# Patient Record
Sex: Male | Born: 1940
Health system: Southern US, Community
[De-identification: ages and names within clinical notes are randomized; demographics above are authoritative.]

## PROBLEM LIST (undated history)

## (undated) DIAGNOSIS — L409 Psoriasis, unspecified: Secondary | ICD-10-CM

## (undated) DIAGNOSIS — E785 Hyperlipidemia, unspecified: Secondary | ICD-10-CM

## (undated) DIAGNOSIS — I10 Essential (primary) hypertension: Secondary | ICD-10-CM

## (undated) HISTORY — PX: APPENDECTOMY: SHX54

---

## 2010-03-03 ENCOUNTER — Ambulatory Visit: Payer: Self-pay | Admitting: Gastroenterology

## 2017-01-16 ENCOUNTER — Other Ambulatory Visit: Payer: Self-pay | Admitting: Internal Medicine

## 2017-01-16 DIAGNOSIS — R1319 Other dysphagia: Secondary | ICD-10-CM

## 2017-01-26 ENCOUNTER — Ambulatory Visit
Admission: RE | Admit: 2017-01-26 | Discharge: 2017-01-26 | Disposition: A | Discharge status: Home / Self Care | Payer: Medicare HMO | Source: Ambulatory Visit | Attending: Internal Medicine | Admitting: Internal Medicine

## 2017-01-26 DIAGNOSIS — R1310 Dysphagia, unspecified: Secondary | ICD-10-CM | POA: Diagnosis not present

## 2017-01-26 DIAGNOSIS — K219 Gastro-esophageal reflux disease without esophagitis: Secondary | ICD-10-CM | POA: Diagnosis not present

## 2017-01-26 DIAGNOSIS — R1319 Other dysphagia: Secondary | ICD-10-CM

## 2018-07-22 ENCOUNTER — Encounter: Payer: Self-pay | Admitting: Emergency Medicine

## 2018-07-22 ENCOUNTER — Emergency Department
Admission: EM | Admit: 2018-07-22 | Discharge: 2018-07-22 | Disposition: A | Discharge status: Home / Self Care | Payer: Medicare HMO | Attending: Emergency Medicine | Admitting: Emergency Medicine

## 2018-07-22 ENCOUNTER — Emergency Department: Payer: Medicare HMO

## 2018-07-22 ENCOUNTER — Other Ambulatory Visit: Payer: Self-pay

## 2018-07-22 DIAGNOSIS — F17200 Nicotine dependence, unspecified, uncomplicated: Secondary | ICD-10-CM | POA: Diagnosis not present

## 2018-07-22 DIAGNOSIS — J101 Influenza due to other identified influenza virus with other respiratory manifestations: Secondary | ICD-10-CM | POA: Insufficient documentation

## 2018-07-22 DIAGNOSIS — I10 Essential (primary) hypertension: Secondary | ICD-10-CM | POA: Insufficient documentation

## 2018-07-22 DIAGNOSIS — R05 Cough: Secondary | ICD-10-CM | POA: Diagnosis present

## 2018-07-22 HISTORY — DX: Essential (primary) hypertension: I10

## 2018-07-22 HISTORY — DX: Hyperlipidemia, unspecified: E78.5

## 2018-07-22 LAB — BASIC METABOLIC PANEL
Anion gap: 9 (ref 5–15)
BUN: 12 mg/dL (ref 8–23)
CO2: 25 mmol/L (ref 22–32)
Calcium: 8.5 mg/dL — ABNORMAL LOW (ref 8.9–10.3)
Chloride: 102 mmol/L (ref 98–111)
Creatinine, Ser: 0.78 mg/dL (ref 0.61–1.24)
GFR calc Af Amer: >60 mL/min (ref >60)
GFR calc non Af Amer: >60 mL/min (ref >60)
Glucose, Bld: 124 mg/dL — ABNORMAL HIGH (ref 70–99)
Potassium: 3.8 mmol/L (ref 3.5–5.1)
Sodium: 136 mmol/L (ref 135–145)

## 2018-07-22 LAB — INFLUENZA PANEL BY PCR (TYPE A & B)
Influenza A By PCR: POSITIVE — AB
Influenza B By PCR: NEGATIVE

## 2018-07-22 LAB — CBC WITH DIFFERENTIAL/PLATELET
Abs Immature Granulocytes: 0.02 10*3/uL (ref 0.00–0.07)
Basophils Absolute: 0 10*3/uL (ref 0.0–0.1)
Basophils Relative: 0 %
Eosinophils Absolute: 0.1 10*3/uL (ref 0.0–0.5)
Eosinophils Relative: 1 %
HCT: 45.3 % (ref 39.0–52.0)
HEMOGLOBIN: 15.8 g/dL (ref 13.0–17.0)
Immature Granulocytes: 0 %
Lymphocytes Relative: 10 %
Lymphs Abs: 0.8 10*3/uL (ref 0.7–4.0)
MCH: 33.1 pg (ref 26.0–34.0)
MCHC: 34.9 g/dL (ref 30.0–36.0)
MCV: 94.8 fL (ref 80.0–100.0)
Monocytes Absolute: 0.8 10*3/uL (ref 0.1–1.0)
Monocytes Relative: 11 %
Neutro Abs: 5.9 10*3/uL (ref 1.7–7.7)
Neutrophils Relative %: 78 %
Platelets: 158 10*3/uL (ref 150–400)
RBC: 4.78 MIL/uL (ref 4.22–5.81)
RDW: 13 % (ref 11.5–15.5)
WBC: 7.6 10*3/uL (ref 4.0–10.5)
nRBC: 0 % (ref 0.0–0.2)

## 2018-07-22 LAB — TROPONIN I: Troponin I: <0.03 ng/mL (ref <0.03)

## 2018-07-22 NOTE — ED Triage Notes (Signed)
Pt arrives POV to triage with c/o cough and cold symptoms x 1 week. Pt reports painful chest upon coughing and states that there are several people around him who are sick at this time. Pt is in NAD.

## 2018-07-22 NOTE — ED Provider Notes (Signed)
[facility] Emergency Department Provider Note  ____________________________________________   First MD Initiated Contact with Patient 07/22/18 [phone]     (approximate)  I have reviewed the triage vital signs and the nursing notes.   HISTORY  Chief Complaint Flu like symptoms    HPI Charles Madden is a 78 y.o. male who is generally healthy for his age who presents by private vehicle for evaluation of flulike symptoms for the last 4 to 5 days.  He reports that he has had nasal congestion, cough, fatigue, generalized body aches, and general malaise and fatigue.  His symptoms been gradually worsening over time so he felt like he should get checked out.  Nothing in particular makes his symptoms better although he has been trying to take over-the-counter cold and flu medicine.  Nothing in particular makes it worse but he does cough and become slightly more short of breath with exertion.  He denies fever, chest pain, nausea, vomiting, abdominal pain, and dysuria.  He has not had much of an appetite but he has been trying to eat and stay hydrated.  He describes the symptoms as moderate.  Past Medical History:  Diagnosis Date  . Hyperlipidemia   . Hypertension     There are no active problems to display for this patient.   Past Surgical History:  Procedure Laterality Date  . APPENDECTOMY      Prior to Admission medications   Not on File    Allergies Patient has no known allergies.  No family history on file.  Social History Social History   Tobacco Use  . Smoking status: Current Every Day Smoker    Packs/day: 0.25  . Smokeless tobacco: Never Used  Substance Use Topics  . Alcohol use: Not Currently  . Drug use: Never    Review of Systems Constitutional: No fever/chills, but myalgias, fatigue, and malaise. Eyes: No visual changes. ENT: Mild sore throat.  Nasal congestion and runny nose. Cardiovascular: Denies chest pain. Respiratory: Cough  with associated shortness of breath but no shortness of breath at baseline. Gastrointestinal: No abdominal pain.  No nausea, no vomiting.  No diarrhea.  No constipation. Genitourinary: Negative for dysuria. Musculoskeletal: Negative for neck pain.  Negative for back pain. Integumentary: Negative for rash. Neurological: Negative for headaches, focal weakness or numbness.   ____________________________________________   PHYSICAL EXAM:  VITAL SIGNS: ED Triage Vitals  Enc Vitals Group     BP 07/22/18 0220 (!) 145/55     Pulse Rate 07/22/18 0220 (!) 122     Resp 07/22/18 0220 18     Temp 07/22/18 0220 99.3 F (37.4 C)     Temp Source 07/22/18 0220 Oral     SpO2 07/22/18 0220 95 %     Weight 07/22/18 0221 76.2 kg (168 lb)     Height 07/22/18 0221 1.803 m (5\' 11" )     Head Circumference --      Peak Flow --      Pain Score 07/22/18 0221 10     Pain Loc --      Pain Edu? --      Excl. in [place]? --     Constitutional: Alert and oriented. Well appearing and in no acute distress.  Eyes: Conjunctivae are normal.  Head: Atraumatic. Nose: Mild congestion/rhinnorhea. Mouth/Throat: Mucous membranes are moist.  Oropharynx non-erythematous. Neck: No stridor.  No meningeal signs.   Cardiovascular: Initial tachycardia had almost resolved by the time I saw him with a persistent heart  rate of around 100-105, regular rhythm. Good peripheral circulation. Grossly normal heart sounds. Respiratory: Normal respiratory effort.  No retractions. Lungs CTAB.  Occasional thick sounding cough. Gastrointestinal: Soft and nontender. No distention.  Musculoskeletal: No lower extremity tenderness nor edema. No gross deformities of extremities. Neurologic:  Normal speech and language. No gross focal neurologic deficits are appreciated.  Skin:  Skin is warm, dry and intact. No rash noted. Psychiatric: Mood and affect are normal. Speech and behavior are normal.  ____________________________________________     LABS (all labs ordered are listed, but only abnormal results are displayed)  Labs Reviewed  BASIC METABOLIC PANEL - Abnormal; Notable for the following components:      Result Value   Glucose, Bld 124 (*)    Calcium 8.5 (*)    All other components within normal limits  INFLUENZA PANEL BY PCR (TYPE A & B) - Abnormal; Notable for the following components:   Influenza A By PCR POSITIVE (*)    All other components within normal limits  CBC WITH DIFFERENTIAL/PLATELET  TROPONIN I   ____________________________________________  EKG  ED ECG REPORT I, Hinda Kehr, the attending physician, personally viewed and interpreted this ECG.  Date: 07/22/2018 EKG Time: 02:23 Rate: 122 Rhythm: sinus tachycardia QRS Axis: normal Intervals: normal ST/T Wave abnormalities: No acute ST/T wave changes. Narrative Interpretation: no definitive evidence of acute ischemia; does not meet STEMI criteria.   ____________________________________________  RADIOLOGY I, Hinda Kehr, personally viewed and evaluated these images (plain radiographs) as part of my medical decision making, as well as reviewing the written report by the radiologist.  ED MD interpretation:  No acute abnormalities  Official radiology report(s): Dg Chest 1 View  Result Date: 07/22/2018 CLINICAL DATA:  Cough and cold symptoms. EXAM: CHEST  1 VIEW COMPARISON:  None. FINDINGS: The cardiomediastinal contours are normal. The lungs are clear. Pulmonary vasculature is normal. No consolidation, pleural effusion, or pneumothorax. No acute osseous abnormalities are seen. IMPRESSION: No acute chest findings. Electronically Signed   By: Keith Rake M.D.   On: 07/22/2018 03:18    ____________________________________________   PROCEDURES  Critical Care performed: No   Procedure(s) performed:   Procedures   ____________________________________________   INITIAL IMPRESSION / ASSESSMENT AND PLAN / ED COURSE  As part of my  medical decision making, I reviewed the following data within the [place] History obtained from family, Nursing notes reviewed and incorporated, Labs reviewed , EKG interpreted , Old chart reviewed, Radiograph reviewed  and Notes from prior ED visits    Differential diagnosis includes, but is not limited to, influenza or other viral respiratory illness, pneumonia, bacteremia, sepsis, urinary tract infection.  The patient is clearly having respiratory symptoms and tested positive for influenza A.  He was initially tachycardic after walking into the emergency department but he actually appears normovolemic and says he has been eating and drinking appropriately.  I think he is having some exertional dyspnea and tachypnea but he is quite well-appearing both for his age and for his disease process.  His lab work-up was relatively reassuring with a normal CBC, normal troponin, and normal basic metabolic panel.  Chest x-ray and EKG were reassuring and unremarkable.  I had my usual customary influenza discussion with the patient and his son and they agree with outpatient follow-up.  He has had symptoms for 4 to 5 days and I explained that I would not recommend Tamiflu in this circumstance and he agrees.  He will follow-up with his doctor and  promises to return to the emergency department if he develops any new or worsening symptoms.       ____________________________________________  FINAL CLINICAL IMPRESSION(S) / ED DIAGNOSES  Final diagnoses:  Influenza A     MEDICATIONS GIVEN DURING THIS VISIT:  Medications - No data to display   ED Discharge Orders    None       Note:  This document was prepared using Dragon voice recognition software and may include unintentional dictation errors.    Hinda Kehr, MD 07/22/18 [phone]

## 2018-07-22 NOTE — Discharge Instructions (Addendum)
You were diagnosed with the flu (influenza).  You will feel ill for as much as a few weeks.  Please take any prescribed medications as instructed, and you may use over-the-counter Tylenol and/or ibuprofen as needed according to label instructions (unless you have an allergy to either or have been told by your doctor not to take them).  Please make sure to drink plenty of fluids and refer to the included information about rehydration.  Follow up with your physician as instructed above, and return to the Emergency Department (ED) if you are unable to tolerate fluids due to vomiting, have worsening trouble breathing, become extremely tired or difficult to awaken, or if you develop any other symptoms that concern you. 

## 2018-08-14 ENCOUNTER — Other Ambulatory Visit: Payer: Self-pay | Admitting: Pulmonary Disease

## 2018-08-14 DIAGNOSIS — R911 Solitary pulmonary nodule: Secondary | ICD-10-CM

## 2018-08-14 DIAGNOSIS — J449 Chronic obstructive pulmonary disease, unspecified: Secondary | ICD-10-CM

## 2018-08-14 DIAGNOSIS — R0609 Other forms of dyspnea: Principal | ICD-10-CM

## 2019-02-25 ENCOUNTER — Other Ambulatory Visit: Payer: Self-pay | Admitting: Pulmonary Disease

## 2019-02-26 ENCOUNTER — Other Ambulatory Visit: Payer: Self-pay | Admitting: Pulmonary Disease

## 2019-02-26 DIAGNOSIS — R911 Solitary pulmonary nodule: Secondary | ICD-10-CM

## 2019-03-04 ENCOUNTER — Ambulatory Visit: Admission: RE | Admit: 2019-03-04 | Payer: Medicare HMO | Source: Ambulatory Visit

## 2019-03-11 ENCOUNTER — Other Ambulatory Visit: Payer: Self-pay

## 2019-03-11 ENCOUNTER — Ambulatory Visit
Admission: RE | Admit: 2019-03-11 | Discharge: 2019-03-11 | Disposition: A | Discharge status: Home / Self Care | Payer: Medicare HMO | Source: Ambulatory Visit | Attending: Pulmonary Disease | Admitting: Pulmonary Disease

## 2019-03-11 DIAGNOSIS — R911 Solitary pulmonary nodule: Secondary | ICD-10-CM | POA: Insufficient documentation

## 2019-03-11 LAB — POCT I-STAT CREATININE: Creatinine, Ser: 0.9 mg/dL (ref 0.61–1.24)

## 2019-03-11 MED ORDER — IOHEXOL 300 MG/ML  SOLN
75.0000 mL | Freq: Once | INTRAMUSCULAR | Status: AC | PRN
  Administered 2019-03-11: 75 mL via INTRAVENOUS

## 2019-12-30 ENCOUNTER — Emergency Department: Payer: Medicare HMO

## 2019-12-30 ENCOUNTER — Emergency Department
Admission: EM | Admit: 2019-12-30 | Discharge: 2019-12-30 | Disposition: A | Discharge status: Home / Self Care | Payer: Medicare HMO | Attending: Emergency Medicine | Admitting: Emergency Medicine

## 2019-12-30 ENCOUNTER — Other Ambulatory Visit: Payer: Self-pay

## 2019-12-30 DIAGNOSIS — J40 Bronchitis, not specified as acute or chronic: Secondary | ICD-10-CM | POA: Diagnosis not present

## 2019-12-30 DIAGNOSIS — R0602 Shortness of breath: Secondary | ICD-10-CM | POA: Diagnosis present

## 2019-12-30 DIAGNOSIS — F17210 Nicotine dependence, cigarettes, uncomplicated: Secondary | ICD-10-CM | POA: Insufficient documentation

## 2019-12-30 LAB — COMPREHENSIVE METABOLIC PANEL
ALT: 19 U/L (ref 0–44)
AST: 23 U/L (ref 15–41)
Albumin: 4.2 g/dL (ref 3.5–5.0)
Alkaline Phosphatase: 57 U/L (ref 38–126)
Anion gap: 9 (ref 5–15)
BUN: 13 mg/dL (ref 8–23)
CO2: 22 mmol/L (ref 22–32)
Calcium: 9.3 mg/dL (ref 8.9–10.3)
Chloride: 108 mmol/L (ref 98–111)
Creatinine, Ser: 0.91 mg/dL (ref 0.61–1.24)
GFR calc Af Amer: >60 mL/min (ref >60)
GFR calc non Af Amer: >60 mL/min (ref >60)
Glucose, Bld: 90 mg/dL (ref 70–99)
Potassium: 4 mmol/L (ref 3.5–5.1)
Sodium: 139 mmol/L (ref 135–145)
Total Bilirubin: 0.8 mg/dL (ref 0.3–1.2)
Total Protein: 7 g/dL (ref 6.5–8.1)

## 2019-12-30 LAB — TROPONIN I (HIGH SENSITIVITY)
Troponin I (High Sensitivity): 3 ng/L (ref <18)
Troponin I (High Sensitivity): 4 ng/L (ref <18)

## 2019-12-30 LAB — CBC
HCT: 46 % (ref 39.0–52.0)
Hemoglobin: 16.2 g/dL (ref 13.0–17.0)
MCH: 34.2 pg — ABNORMAL HIGH (ref 26.0–34.0)
MCHC: 35.2 g/dL (ref 30.0–36.0)
MCV: 97 fL (ref 80.0–100.0)
Platelets: 201 10*3/uL (ref 150–400)
RBC: 4.74 MIL/uL (ref 4.22–5.81)
RDW: 13.2 % (ref 11.5–15.5)
WBC: 7 10*3/uL (ref 4.0–10.5)
nRBC: 0 % (ref 0.0–0.2)

## 2019-12-30 MED ORDER — ALBUTEROL SULFATE HFA 108 (90 BASE) MCG/ACT IN AERS: 2.0000 | INHALATION_SPRAY | RESPIRATORY_TRACT | 0 refills | Status: DC | PRN

## 2019-12-30 MED ORDER — ALBUTEROL SULFATE (2.5 MG/3ML) 0.083% IN NEBU
2.5000 mg | INHALATION_SOLUTION | Freq: Once | RESPIRATORY_TRACT | Status: AC
  Administered 2019-12-30: 2.5 mg via RESPIRATORY_TRACT
  Filled 2019-12-30: qty 3

## 2019-12-30 MED ORDER — PREDNISONE 20 MG PO TABS: 60.0000 mg | ORAL_TABLET | Freq: Every day | ORAL | 0 refills | Status: AC

## 2019-12-30 MED ORDER — PREDNISONE 20 MG PO TABS
60.0000 mg | ORAL_TABLET | Freq: Once | ORAL | Status: AC
  Administered 2019-12-30: 60 mg via ORAL
  Filled 2019-12-30: qty 3

## 2019-12-30 NOTE — ED Triage Notes (Signed)
Pt states several weeks of shob and dizziness. Pt states has had chest tightness for 3-4 days. Pt appears shob with exertion. Pt denies known fever or cough.

## 2019-12-30 NOTE — ED Provider Notes (Signed)
[facility] Emergency Department Provider Note ____________________________________________   First MD Initiated Contact with Patient 12/30/19 [phone]     (approximate)  I have reviewed the triage vital signs and the nursing notes.   HISTORY  Chief Complaint Shortness of Breath    HPI Charles Madden is a 79 y.o. male with PMH as noted below who presents with shortness of breath over the last 2 weeks, gradual onset, intermittent, worse when he is lying down, and not exertional.  He denies associated chest pain, cough, or fever.  He has no leg swelling or pain.  He denies any prior history of this symptom.  He has not taken anything for it at home.  The patient is a smoker, about 1/2 pack/day.  Past Medical History:  Diagnosis Date  . Hyperlipidemia   . Hypertension     There are no problems to display for this patient.   Past Surgical History:  Procedure Laterality Date  . APPENDECTOMY      Prior to Admission medications   Medication Sig Start Date End Date Taking? Authorizing Provider  albuterol (VENTOLIN HFA) 108 (90 Base) MCG/ACT inhaler Inhale 2 puffs into the lungs every 4 (four) hours as needed for wheezing or shortness of breath. 12/30/19   Arta Silence, MD  predniSONE (DELTASONE) 20 MG tablet Take 3 tablets (60 mg total) by mouth daily for 4 days. Start the day after the ER visit 12/31/19 01/04/20  Arta Silence, MD    Allergies Patient has no known allergies.  No family history on file.  Social History Social History   Tobacco Use  . Smoking status: Current Every Day Smoker    Packs/day: 0.25  . Smokeless tobacco: Never Used  Substance Use Topics  . Alcohol use: Not Currently  . Drug use: Never    Review of Systems  Constitutional: No fever/chills. Eyes: No redness. ENT: No sore throat. Cardiovascular: Denies chest pain. Respiratory: Positive for shortness of breath. Gastrointestinal: No vomiting or diarrhea.    Genitourinary: Negative for dysuria.  Musculoskeletal: Negative for back pain. Skin: Negative for rash. Neurological: Negative for headache.   ____________________________________________   PHYSICAL EXAM:  VITAL SIGNS: ED Triage Vitals [12/30/19 0625]  Enc Vitals Group     BP (!) 141/64     Pulse Rate 62     Resp (!) 22     Temp 99 F (37.2 C)     Temp Source Oral     SpO2 100 %     Weight 169 lb (76.7 kg)     Height 5\' 9"  (1.753 m)     Head Circumference      Peak Flow      Pain Score 0     Pain Loc      Pain Edu?      Excl. in [place]?     Constitutional: Alert and oriented.  Relatively well appearing and in no acute distress. Eyes: Conjunctivae are normal.  Head: Atraumatic. Nose: No congestion/rhinnorhea. Mouth/Throat: Mucous membranes are moist.   Neck: Normal range of motion.  Cardiovascular: Normal rate, regular rhythm. Grossly normal heart sounds.  Good peripheral circulation. Respiratory: Minimally increased respiratory effort.  No retractions.  Slightly diminished breath sounds bilaterally.  No wheezes or rales. Gastrointestinal: No distention.  Musculoskeletal: No lower extremity edema.  Extremities warm and well perfused.  Neurologic:  Normal speech and language. No gross focal neurologic deficits are appreciated.  Skin:  Skin is warm and dry. No rash noted.  Psychiatric: Mood and affect are normal. Speech and behavior are normal.  ____________________________________________   LABS (all labs ordered are listed, but only abnormal results are displayed)  Labs Reviewed  CBC - Abnormal; Notable for the following components:      Result Value   MCH 34.2 (*)    All other components within normal limits  COMPREHENSIVE METABOLIC PANEL  TROPONIN I (HIGH SENSITIVITY)  TROPONIN I (HIGH SENSITIVITY)   ____________________________________________  EKG  ED ECG REPORT I, Arta Silence, the attending physician, personally viewed and interpreted this  ECG.  Date: 12/30/2019 EKG Time: 0620 Rate: 63 Rhythm: normal sinus rhythm QRS Axis: normal Intervals: normal ST/T Wave abnormalities: normal Narrative Interpretation: no evidence of acute ischemia  ____________________________________________  RADIOLOGY  CXR: No focal infiltrate or other acute abnormality  ____________________________________________   PROCEDURES  Procedure(s) performed: No  Procedures  Critical Care performed: No ____________________________________________   INITIAL IMPRESSION / ASSESSMENT AND PLAN / ED COURSE  Pertinent labs & imaging results that were available during my care of the patient were reviewed by me and considered in my medical decision making (see chart for details).  79 year old male with PMH as noted above presents with intermittent shortness of breath over the last few weeks which is nonexertional and not associated with cough, fever, or chest pain.  The patient is a longstanding smoker but has no documented history of COPD or chronic bronchitis.  I reviewed the past medical records in [place].  The patient has 1 prior ED visit here last year for flulike symptoms.  On exam, he is very well-appearing.  Vital signs are normal.  O2 saturation is 100% on room air.  He does have slightly increased work of breathing, but no acute respiratory distress and is speaking full sentences.  His lungs are clear but with slightly diminished breath sounds bilaterally.  Overall presentation is most consistent with acute or possible new onset chronic bronchitis/COPD.  I have a low suspicion for CHF or other cardiac etiology.  The patient has no clinical symptoms to suggest PE.  He has no hypoxia, tachycardia, or leg swelling.  Chest x-ray obtained from triage is unremarkable, and the lab work-up is within normal limits.  We will give a trial of nebs and reassess.  ----------------------------------------- 10:16 AM on  12/30/2019 -----------------------------------------  The patient had improvement in his symptoms with the nebulizer treatment.  Repeat troponin is negative.  At this time, the patient is stable for discharge home.  I will treat for presumed bronchitis with a short course of prednisone and albuterol by inhaler at home.  Patient feels comfortable going home.  I counseled him on the results of the work-up.  I instructed him to follow-up with his PMD within 1 to 2 weeks.  Return precautions given, the patient expressed understanding. ____________________________________________   FINAL CLINICAL IMPRESSION(S) / ED DIAGNOSES  Final diagnoses:  Bronchitis      NEW MEDICATIONS STARTED DURING THIS VISIT:  New Prescriptions   ALBUTEROL (VENTOLIN HFA) 108 (90 BASE) MCG/ACT INHALER    Inhale 2 puffs into the lungs every 4 (four) hours as needed for wheezing or shortness of breath.   PREDNISONE (DELTASONE) 20 MG TABLET    Take 3 tablets (60 mg total) by mouth daily for 4 days. Start the day after the ER visit     Note:  This document was prepared using Dragon voice recognition software and may include unintentional dictation errors.   Arta Silence, MD 12/30/19 1017

## 2019-12-30 NOTE — ED Notes (Signed)
Pt alert and oriented X 4, stable for discharge. RR even and unlabored, color WNL. Discussed discharge instructions and follow up when appropriate. Instructed to follow up with ER for any life threatening symptoms or concerns that patient or family of patient may have  

## 2019-12-30 NOTE — Discharge Instructions (Addendum)
Use the albuterol inhaler every 4-6 hours for at least the next several days.  Take the prednisone starting tomorrow and for 4 days.  Follow-up with your primary care doctor in the next 1 to 2 weeks.  Return to the ER for new, worsening, or persistent severe shortness of breath, fever chills, vomiting, chest pain, or any other new or worsening symptoms that concern you.

## 2020-11-06 ENCOUNTER — Other Ambulatory Visit: Payer: Self-pay

## 2020-11-06 ENCOUNTER — Encounter: Payer: Self-pay | Admitting: Emergency Medicine

## 2020-11-06 ENCOUNTER — Emergency Department: Payer: Medicare HMO

## 2020-11-06 ENCOUNTER — Emergency Department
Admission: EM | Admit: 2020-11-06 | Discharge: 2020-11-06 | Disposition: A | Discharge status: Home / Self Care | Payer: Medicare HMO | Attending: Emergency Medicine | Admitting: Emergency Medicine

## 2020-11-06 DIAGNOSIS — R0602 Shortness of breath: Secondary | ICD-10-CM | POA: Diagnosis not present

## 2020-11-06 DIAGNOSIS — F17210 Nicotine dependence, cigarettes, uncomplicated: Secondary | ICD-10-CM | POA: Insufficient documentation

## 2020-11-06 DIAGNOSIS — I10 Essential (primary) hypertension: Secondary | ICD-10-CM | POA: Insufficient documentation

## 2020-11-06 DIAGNOSIS — R0789 Other chest pain: Secondary | ICD-10-CM | POA: Insufficient documentation

## 2020-11-06 LAB — BASIC METABOLIC PANEL
Anion gap: 7 (ref 5–15)
BUN: 19 mg/dL (ref 8–23)
CO2: 25 mmol/L (ref 22–32)
Calcium: 9 mg/dL (ref 8.9–10.3)
Chloride: 105 mmol/L (ref 98–111)
Creatinine, Ser: 1.05 mg/dL (ref 0.61–1.24)
GFR, Estimated: >60 mL/min (ref >60)
Glucose, Bld: 122 mg/dL — ABNORMAL HIGH (ref 70–99)
Potassium: 3.8 mmol/L (ref 3.5–5.1)
Sodium: 137 mmol/L (ref 135–145)

## 2020-11-06 LAB — CBC
HCT: 44.2 % (ref 39.0–52.0)
Hemoglobin: 15.2 g/dL (ref 13.0–17.0)
MCH: 33.6 pg (ref 26.0–34.0)
MCHC: 34.4 g/dL (ref 30.0–36.0)
MCV: 97.8 fL (ref 80.0–100.0)
Platelets: 208 10*3/uL (ref 150–400)
RBC: 4.52 MIL/uL (ref 4.22–5.81)
RDW: 13 % (ref 11.5–15.5)
WBC: 6.9 10*3/uL (ref 4.0–10.5)
nRBC: 0 % (ref 0.0–0.2)

## 2020-11-06 LAB — TROPONIN I (HIGH SENSITIVITY): Troponin I (High Sensitivity): 5 ng/L (ref <18)

## 2020-11-06 NOTE — ED Notes (Signed)
Pt returned from Xray at this time  

## 2020-11-06 NOTE — ED Notes (Signed)
Student PA at bedside at this time.

## 2020-11-06 NOTE — ED Triage Notes (Signed)
Pt arrived via POV with reports of shortness of breath that started Thursday morning around 2am, pt states the shortness of breath is worse when lying down. Pt states he has not felt good all day, states he continues to have shortness of breath throughout the night tonight. Pt c/o tightness in chest as well.

## 2020-11-07 NOTE — ED Provider Notes (Signed)
[facility] Emergency Department Provider Note   ____________________________________________   Event Date/Time   First MD Initiated Contact with Patient 11/06/20 [phone]     (approximate)  I have reviewed the triage vital signs and the nursing notes.   HISTORY  Chief Complaint Shortness of Breath    HPI Charles Madden is a 80 y.o. male with below stated past medical history presents via POV with shortness of breath and chest tightness that began after he woke up at 2 AM.  Patient states that he has been tired than usual lately but denies any other ill symptoms.  Patient states that his wife tells him that he stops breathing occasionally in his sleep but cannot tell me that that is what happened tonight.  Patient states that he has never stopped breathing long enough to wake himself up from sleep.  Patient states that his sensation of shortness of breath and chest tightness have been improving since he woke up.  Patient denies radiation of this pain nor does he endorse dyspnea on exertion or worsening of this pain on exertion.  Endorses only minimal pain at this time.  Patient currently denies any vision changes, tinnitus, difficulty speaking, facial droop, sore throat,  abdominal pain, nausea/vomiting/diarrhea, dysuria, or weakness/numbness/paresthesias in any extremity         Past Medical History:  Diagnosis Date  . Hyperlipidemia   . Hypertension     There are no problems to display for this patient.   Past Surgical History:  Procedure Laterality Date  . APPENDECTOMY      Prior to Admission medications   Medication Sig Start Date End Date Taking? Authorizing Provider  albuterol (VENTOLIN HFA) 108 (90 Base) MCG/ACT inhaler Inhale 2 puffs into the lungs every 4 (four) hours as needed for wheezing or shortness of breath. 12/30/19   Arta Silence, MD    Allergies Patient has no known allergies.  History reviewed. No pertinent family  history.  Social History Social History   Tobacco Use  . Smoking status: Current Every Day Smoker    Packs/day: 0.25  . Smokeless tobacco: Never Used  Substance Use Topics  . Alcohol use: Not Currently  . Drug use: Never    Review of Systems Constitutional: No fever/chills Eyes: No visual changes. ENT: No sore throat. Cardiovascular: Endorses chest pain. Respiratory: Endorses shortness of breath. Gastrointestinal: No abdominal pain.  No nausea, no vomiting.  No diarrhea. Genitourinary: Negative for dysuria. Musculoskeletal: Negative for acute arthralgias Skin: Negative for rash. Neurological: Negative for headaches, weakness/numbness/paresthesias in any extremity Psychiatric: Negative for suicidal ideation/homicidal ideation   ____________________________________________   PHYSICAL EXAM:  VITAL SIGNS: ED Triage Vitals  Enc Vitals Group     BP 11/06/20 0344 (!) 150/78     Pulse Rate 11/06/20 0344 72     Resp 11/06/20 0344 20     Temp 11/06/20 0344 97.6 F (36.4 C)     Temp Source 11/06/20 0344 Oral     SpO2 11/06/20 0344 97 %     Weight 11/06/20 0342 178 lb (80.7 kg)     Height 11/06/20 0342 5\' 8"  (1.727 m)     Head Circumference --      Peak Flow --      Pain Score 11/06/20 0342 0     Pain Loc --      Pain Edu? --      Excl. in [place]? --    Constitutional: Alert and oriented. Well appearing and in  no acute distress. Eyes: Conjunctivae are normal. PERRL. Head: Atraumatic. Nose: No congestion/rhinnorhea. Mouth/Throat: Mucous membranes are moist. Neck: No stridor Cardiovascular: Grossly normal heart sounds.  Good peripheral circulation. Respiratory: Normal respiratory effort.  No retractions. Gastrointestinal: Soft and nontender. No distention. Musculoskeletal: No obvious deformities Neurologic:  Normal speech and language. No gross focal neurologic deficits are appreciated. Skin:  Skin is warm and dry. No rash noted. Psychiatric: Mood and affect are normal.  Speech and behavior are normal.  ____________________________________________   LABS (all labs ordered are listed, but only abnormal results are displayed)  Labs Reviewed  BASIC METABOLIC PANEL - Abnormal; Notable for the following components:      Result Value   Glucose, Bld 122 (*)    All other components within normal limits  CBC  TROPONIN I (HIGH SENSITIVITY)   ____________________________________________  EKG  ED ECG REPORT I, Naaman Plummer, the attending physician, personally viewed and interpreted this ECG.  Date: 11/07/2020 EKG Time: 347 Rate: 59 Rhythm: normal sinus rhythm QRS Axis: normal Intervals: normal ST/T Wave abnormalities: normal Narrative Interpretation: no evidence of acute ischemia  ____________________________________________  RADIOLOGY  ED MD interpretation: 2 view chest x-ray shows no evidence of acute abnormalities including no pneumonia, pneumothorax, or widened mediastinum  Official radiology report(s): DG Chest 2 View  Result Date: 11/06/2020 CLINICAL DATA:  Shortness of breath and chest tightness EXAM: CHEST - 2 VIEW COMPARISON:  12/30/2019 FINDINGS: Artifact from EKG leads. There is no edema, consolidation, effusion, or pneumothorax. Normal heart size and mediastinal contours. IMPRESSION: Negative chest. Electronically Signed   By: Monte Fantasia M.D.   On: 11/06/2020 04:33    ____________________________________________   PROCEDURES  Procedure(s) performed (including Critical Care):  .1-3 Lead EKG Interpretation Performed by: Naaman Plummer, MD Authorized by: Naaman Plummer, MD     Interpretation: normal     ECG rate:  75   ECG rate assessment: normal     Rhythm: sinus rhythm     Ectopy: none     Conduction: normal       ____________________________________________   INITIAL IMPRESSION / ASSESSMENT AND PLAN / ED COURSE  As part of my medical decision making, I reviewed the following data within the [place] notes reviewed and incorporated, Labs reviewed, EKG interpreted, Old chart reviewed, Radiograph reviewed and Notes from prior ED visits reviewed and incorporated     Workup: ECG, CXR, CBC, BMP, Troponin Findings: ECG: No overt evidence of STEMI. No evidence of Brugadas sign, delta wave, epsilon wave, significantly prolonged QTc, or malignant arrhythmia HS Troponin: Negative x1 Other Labs unremarkable for emergent problems. CXR: Without PTX, PNA, or widened mediastinum Last Stress Test: Denies Last Heart Catheterization: Denies HEART Score: 3  Given History, Exam, and Workup I have low suspicion for ACS, Pneumothorax, Pneumonia, Pulmonary Embolus, Tamponade, Aortic Dissection or other emergent problem as a cause for this presentation.   Reassesment: Prior to discharge patients pain was controlled and they were well appearing.  Disposition:  Discharge. Strict return precautions discussed with patient with full understanding. Advised patient to follow up promptly with primary care provider      ____________________________________________   FINAL CLINICAL IMPRESSION(S) / ED DIAGNOSES  Final diagnoses:  Shortness of breath  Chest tightness     ED Discharge Orders    None       Note:  This document was prepared using Dragon voice recognition software and may include unintentional dictation errors.   Valora Piccolo  K, MD 11/07/20 9935

## 2021-01-09 ENCOUNTER — Emergency Department: Payer: Medicare HMO

## 2021-01-09 ENCOUNTER — Emergency Department
Admission: EM | Admit: 2021-01-09 | Discharge: 2021-01-10 | Disposition: A | Discharge status: Home / Self Care | Payer: Medicare HMO | Attending: Student in an Organized Health Care Education/Training Program | Admitting: Student in an Organized Health Care Education/Training Program

## 2021-01-09 ENCOUNTER — Other Ambulatory Visit: Payer: Self-pay

## 2021-01-09 DIAGNOSIS — I700 Atherosclerosis of aorta: Secondary | ICD-10-CM | POA: Diagnosis not present

## 2021-01-09 DIAGNOSIS — I2510 Atherosclerotic heart disease of native coronary artery without angina pectoris: Secondary | ICD-10-CM | POA: Diagnosis not present

## 2021-01-09 DIAGNOSIS — R0602 Shortness of breath: Secondary | ICD-10-CM | POA: Diagnosis not present

## 2021-01-09 DIAGNOSIS — F17210 Nicotine dependence, cigarettes, uncomplicated: Secondary | ICD-10-CM | POA: Insufficient documentation

## 2021-01-09 DIAGNOSIS — I10 Essential (primary) hypertension: Secondary | ICD-10-CM | POA: Diagnosis not present

## 2021-01-09 DIAGNOSIS — R0789 Other chest pain: Secondary | ICD-10-CM | POA: Diagnosis present

## 2021-01-09 DIAGNOSIS — J441 Chronic obstructive pulmonary disease with (acute) exacerbation: Secondary | ICD-10-CM | POA: Diagnosis not present

## 2021-01-09 DIAGNOSIS — Z8616 Personal history of COVID-19: Secondary | ICD-10-CM | POA: Diagnosis not present

## 2021-01-09 DIAGNOSIS — M19011 Primary osteoarthritis, right shoulder: Secondary | ICD-10-CM | POA: Diagnosis not present

## 2021-01-09 LAB — BASIC METABOLIC PANEL
Anion gap: 4 — ABNORMAL LOW (ref 5–15)
BUN: 14 mg/dL (ref 8–23)
CO2: 27 mmol/L (ref 22–32)
Calcium: 9.1 mg/dL (ref 8.9–10.3)
Chloride: 107 mmol/L (ref 98–111)
Creatinine, Ser: 0.91 mg/dL (ref 0.61–1.24)
GFR, Estimated: >60 mL/min (ref >60)
Glucose, Bld: 108 mg/dL — ABNORMAL HIGH (ref 70–99)
Potassium: 3.3 mmol/L — ABNORMAL LOW (ref 3.5–5.1)
Sodium: 138 mmol/L (ref 135–145)

## 2021-01-09 LAB — CBC
HCT: 41.4 % (ref 39.0–52.0)
Hemoglobin: 14.4 g/dL (ref 13.0–17.0)
MCH: 33.9 pg (ref 26.0–34.0)
MCHC: 34.8 g/dL (ref 30.0–36.0)
MCV: 97.4 fL (ref 80.0–100.0)
Platelets: 191 10*3/uL (ref 150–400)
RBC: 4.25 MIL/uL (ref 4.22–5.81)
RDW: 13.4 % (ref 11.5–15.5)
WBC: 5.7 10*3/uL (ref 4.0–10.5)
nRBC: 0 % (ref 0.0–0.2)

## 2021-01-09 LAB — TROPONIN I (HIGH SENSITIVITY)
Troponin I (High Sensitivity): 4 ng/L (ref <18)
Troponin I (High Sensitivity): 4 ng/L (ref <18)

## 2021-01-09 MED ORDER — IPRATROPIUM-ALBUTEROL 0.5-2.5 (3) MG/3ML IN SOLN
3.0000 mL | Freq: Once | RESPIRATORY_TRACT | Status: AC
  Administered 2021-01-09: 3 mL via RESPIRATORY_TRACT
  Filled 2021-01-09: qty 3

## 2021-01-09 MED ORDER — IOHEXOL 350 MG/ML SOLN
75.0000 mL | Freq: Once | INTRAVENOUS | Status: AC | PRN
  Administered 2021-01-09: 75 mL via INTRAVENOUS

## 2021-01-09 MED ORDER — ALBUTEROL SULFATE HFA 108 (90 BASE) MCG/ACT IN AERS: 2.0000 | INHALATION_SPRAY | RESPIRATORY_TRACT | 0 refills | Status: DC | PRN

## 2021-01-09 MED ORDER — PREDNISONE 20 MG PO TABS: 40.0000 mg | ORAL_TABLET | Freq: Every day | ORAL | 0 refills | Status: AC

## 2021-01-09 MED ORDER — PREDNISONE 20 MG PO TABS: 40.0000 mg | ORAL_TABLET | Freq: Every day | ORAL | 0 refills | Status: DC

## 2021-01-09 NOTE — ED Provider Notes (Signed)
Emergency Medicine Provider Triage Evaluation Note  Charles Madden , a 80 y.o. male  was evaluated in triage.  Pt complains of mild increased SOB since last night. Notes hx of COPD, uses occasional inhaler when needed, but hasn't had improvement with his inhaler. Denies any chest pain, dizziness or fever.  Review of Systems  Positive: Shortness of breath Negative: Chest pain, fever  Physical Exam  BP 105/71   Pulse 72   Temp 97.6 F (36.4 C) (Oral)   Resp 18   Ht 5\' 9"  (1.753 m)   Wt 77.1 kg   SpO2 98%   BMI 25.10 kg/m  Gen:   Awake, no distress   Resp:  Normal effort  MSK:   Moves extremities without difficulty  Other:    Medical Decision Making  Medically screening exam initiated at 4:49 PM.  Appropriate orders placed.  Charles Madden was informed that the remainder of the evaluation will be completed by another provider, this initial triage assessment does not replace that evaluation, and the importance of remaining in the ED until their evaluation is complete.     Marlana Salvage, PA 01/09/21 1650    Lucrezia Starch, MD 01/09/21 [phone]

## 2021-01-09 NOTE — ED Notes (Signed)
Charles Madden, [phone]

## 2021-01-09 NOTE — ED Triage Notes (Signed)
Pt states SOB since last night. States "a touch of COPD and asthma." Used inhaler at home. A&0, speaking in complete sentences. Had COVID 4 weeks. Denies cough. States some chest congestion. Denies pain.

## 2021-01-09 NOTE — ED Provider Notes (Signed)
[facility] Emergency Department Provider Note    Event Date/Time   First MD Initiated Contact with Patient 01/09/21 2037     (approximate)  I have reviewed the triage vital signs and the nursing notes.   HISTORY  Chief Complaint Shortness of Breath    HPI Charles Madden is a 80 y.o. male with the below listed past medical history as well as reported history of COPD/asthma and COVID illness roughly 2 months ago presents to the ER for chest discomfort as well as persistent shortness of breath.  Does feel some mild change with inhaler.  No productive cough.  Does have some discomfort with deep inspiration.  No history of blood clots.  No lower extremity swelling.  States that symptoms been ongoing for about a week.  Does not wear home oxygen.  Past Medical History:  Diagnosis Date   Hyperlipidemia    Hypertension    History reviewed. No pertinent family history. Past Surgical History:  Procedure Laterality Date   APPENDECTOMY     There are no problems to display for this patient.     Prior to Admission medications   Medication Sig Start Date End Date Taking? Authorizing Provider  predniSONE (DELTASONE) 20 MG tablet Take 2 tablets (40 mg total) by mouth daily for 5 days. 01/09/21 01/14/21 Yes Merlyn Lot, MD  albuterol (VENTOLIN HFA) 108 (90 Base) MCG/ACT inhaler Inhale 2 puffs into the lungs every 4 (four) hours as needed for wheezing or shortness of breath. 01/09/21   Merlyn Lot, MD    Allergies Patient has no known allergies.    Social History Social History   Tobacco Use   Smoking status: Every Day    Packs/day: 0.25    Pack years: 0.00    Types: Cigarettes   Smokeless tobacco: Never  Substance Use Topics   Alcohol use: Not Currently   Drug use: Never    Review of Systems Patient denies headaches, rhinorrhea, blurry vision, numbness, shortness of breath, chest pain, edema, cough, abdominal pain, nausea, vomiting,  diarrhea, dysuria, fevers, rashes or hallucinations unless otherwise stated above in HPI. ____________________________________________   PHYSICAL EXAM:  VITAL SIGNS: Vitals:   01/09/21 2054 01/09/21 2228  BP: 110/74 112/71  Pulse: 77 74  Resp: 17 17  Temp:    SpO2: 98% 98%    Constitutional: Alert and oriented.  Eyes: Conjunctivae are normal.  Head: Atraumatic. Nose: No congestion/rhinnorhea. Mouth/Throat: Mucous membranes are moist.   Neck: No stridor. Painless ROM.  Cardiovascular: Normal rate, regular rhythm. Grossly normal heart sounds.  Good peripheral circulation. Respiratory: Normal respiratory effort.  No retractions. Lungs with scattered occasional wheeze Gastrointestinal: Soft and nontender. No distention. No abdominal bruits. No CVA tenderness. Genitourinary:  Musculoskeletal: No lower extremity tenderness nor edema.  No joint effusions. Neurologic:  Normal speech and language. No gross focal neurologic deficits are appreciated. No facial droop Skin:  Skin is warm, dry and intact. No rash noted. Psychiatric: Mood and affect are normal. Speech and behavior are normal.   ____________________________________________   LABS (all labs ordered are listed, but only abnormal results are displayed)  Results for orders placed or performed during the hospital encounter of 01/09/21 (from the past 24 hour(s))  Basic metabolic panel     Status: Abnormal   Collection Time: 01/09/21  4:38 PM  Result Value Ref Range   Sodium 138 135 - 145 mmol/L   Potassium 3.3 (L) 3.5 - 5.1 mmol/L   Chloride 107 98 -  111 mmol/L   CO2 27 22 - 32 mmol/L   Glucose, Bld 108 (H) 70 - 99 mg/dL   BUN 14 8 - 23 mg/dL   Creatinine, Ser 0.91 0.61 - 1.24 mg/dL   Calcium 9.1 8.9 - 10.3 mg/dL   GFR, Estimated >60 >60 mL/min   Anion gap 4 (L) 5 - 15  CBC     Status: None   Collection Time: 01/09/21  4:38 PM  Result Value Ref Range   WBC 5.7 4.0 - 10.5 K/uL   RBC 4.25 4.22 - 5.81 MIL/uL    Hemoglobin 14.4 13.0 - 17.0 g/dL   HCT 41.4 39.0 - 52.0 %   MCV 97.4 80.0 - 100.0 fL   MCH 33.9 26.0 - 34.0 pg   MCHC 34.8 30.0 - 36.0 g/dL   RDW 13.4 11.5 - 15.5 %   Platelets 191 150 - 400 K/uL   nRBC 0.0 0.0 - 0.2 %  Troponin I (High Sensitivity)     Status: None   Collection Time: 01/09/21  4:38 PM  Result Value Ref Range   Troponin I (High Sensitivity) 4 <18 ng/L  Troponin I (High Sensitivity)     Status: None   Collection Time: 01/09/21  8:51 PM  Result Value Ref Range   Troponin I (High Sensitivity) 4 <18 ng/L   ____________________________________________  EKG My review and personal interpretation at Time: 16:40   Indication: sob  Rate: 65  Rhythm: sinus Axis: normal Other: normal intervals, no stemi ____________________________________________  RADIOLOGY  I personally reviewed all radiographic images ordered to evaluate for the above acute complaints and reviewed radiology reports and findings.  These findings were personally discussed with the patient.  Please see medical record for radiology report.  ____________________________________________   PROCEDURES  Procedure(s) performed:  Procedures    Critical Care performed: no ____________________________________________   INITIAL IMPRESSION / ASSESSMENT AND PLAN / ED COURSE  Pertinent labs & imaging results that were available during my care of the patient were reviewed by me and considered in my medical decision making (see chart for details).   DDX: Asthma, copd, CHF, pna, ptx, malignancy, Pe, anemia   Charles Madden is a 80 y.o. who presents to the ED with presentation as described above.  Patient well nontoxic-appearing.  EKG nonischemic.  Troponin is negative.  Does not seem consistent with CHF or ACS.  Will give nebulizer.  Given recent COVID illness will order CTA to evaluate for PE.  Clinical Course as of 01/09/21 2344  Sat Jan 09, 2021  2344 CTA is without evidence of PE.  Patient feels  improved after nebulizer treatment this is most consistent with COPD exacerbation.  Not in any distress.  No hypoxia on room air.  Does appear stable and appropriate for outpatient follow-up. [PR]    Clinical Course User Index [PR] Merlyn Lot, MD    The patient was evaluated in Emergency Department today for the symptoms described in the history of present illness. He/she was evaluated in the context of the global COVID-19 pandemic, which necessitated consideration that the patient might be at risk for infection with the SARS-CoV-2 virus that causes COVID-19. Institutional protocols and algorithms that pertain to the evaluation of patients at risk for COVID-19 are in a state of rapid change based on information released by regulatory bodies including the CDC and federal and state organizations. These policies and algorithms were followed during the patient's care in the ED.  As part of my medical decision  making, I reviewed the following data within the [facility] notes reviewed and incorporated, Labs reviewed, notes from prior ED visits and [place] Controlled Substance Database   ____________________________________________   FINAL CLINICAL IMPRESSION(S) / ED DIAGNOSES  Final diagnoses:  COPD exacerbation ([place])      NEW MEDICATIONS STARTED DURING THIS VISIT:  New Prescriptions   PREDNISONE (DELTASONE) 20 MG TABLET    Take 2 tablets (40 mg total) by mouth daily for 5 days.     Note:  This document was prepared using Dragon voice recognition software and may include unintentional dictation errors.    Merlyn Lot, MD 01/09/21 [phone]

## 2021-03-12 DIAGNOSIS — U071 COVID-19: Secondary | ICD-10-CM | POA: Diagnosis not present

## 2021-03-12 DIAGNOSIS — E559 Vitamin D deficiency, unspecified: Secondary | ICD-10-CM | POA: Diagnosis not present

## 2021-03-12 DIAGNOSIS — E785 Hyperlipidemia, unspecified: Secondary | ICD-10-CM | POA: Diagnosis not present

## 2021-03-19 DIAGNOSIS — F411 Generalized anxiety disorder: Secondary | ICD-10-CM | POA: Diagnosis not present

## 2021-03-19 DIAGNOSIS — I10 Essential (primary) hypertension: Secondary | ICD-10-CM | POA: Diagnosis not present

## 2021-03-19 DIAGNOSIS — I872 Venous insufficiency (chronic) (peripheral): Secondary | ICD-10-CM | POA: Diagnosis not present

## 2021-03-19 DIAGNOSIS — E559 Vitamin D deficiency, unspecified: Secondary | ICD-10-CM | POA: Diagnosis not present

## 2021-03-19 DIAGNOSIS — R1319 Other dysphagia: Secondary | ICD-10-CM | POA: Diagnosis not present

## 2021-03-19 DIAGNOSIS — E785 Hyperlipidemia, unspecified: Secondary | ICD-10-CM | POA: Diagnosis not present

## 2021-03-19 DIAGNOSIS — I739 Peripheral vascular disease, unspecified: Secondary | ICD-10-CM | POA: Diagnosis not present

## 2021-03-19 DIAGNOSIS — F17200 Nicotine dependence, unspecified, uncomplicated: Secondary | ICD-10-CM | POA: Diagnosis not present

## 2021-03-19 DIAGNOSIS — J42 Unspecified chronic bronchitis: Secondary | ICD-10-CM | POA: Diagnosis not present

## 2021-06-01 DIAGNOSIS — L400 Psoriasis vulgaris: Secondary | ICD-10-CM | POA: Diagnosis not present

## 2021-06-01 DIAGNOSIS — X32XXXA Exposure to sunlight, initial encounter: Secondary | ICD-10-CM | POA: Diagnosis not present

## 2021-06-01 DIAGNOSIS — Z85828 Personal history of other malignant neoplasm of skin: Secondary | ICD-10-CM | POA: Diagnosis not present

## 2021-06-01 DIAGNOSIS — D2261 Melanocytic nevi of right upper limb, including shoulder: Secondary | ICD-10-CM | POA: Diagnosis not present

## 2021-06-01 DIAGNOSIS — L570 Actinic keratosis: Secondary | ICD-10-CM | POA: Diagnosis not present

## 2021-06-01 DIAGNOSIS — D2271 Melanocytic nevi of right lower limb, including hip: Secondary | ICD-10-CM | POA: Diagnosis not present

## 2021-06-01 DIAGNOSIS — D225 Melanocytic nevi of trunk: Secondary | ICD-10-CM | POA: Diagnosis not present

## 2021-06-03 DIAGNOSIS — Z79899 Other long term (current) drug therapy: Secondary | ICD-10-CM | POA: Diagnosis not present

## 2021-06-15 DIAGNOSIS — E785 Hyperlipidemia, unspecified: Secondary | ICD-10-CM | POA: Diagnosis not present

## 2021-06-21 DIAGNOSIS — E785 Hyperlipidemia, unspecified: Secondary | ICD-10-CM | POA: Diagnosis not present

## 2021-06-21 DIAGNOSIS — Z23 Encounter for immunization: Secondary | ICD-10-CM | POA: Diagnosis not present

## 2021-06-21 DIAGNOSIS — I10 Essential (primary) hypertension: Secondary | ICD-10-CM | POA: Diagnosis not present

## 2021-06-21 DIAGNOSIS — R1319 Other dysphagia: Secondary | ICD-10-CM | POA: Diagnosis not present

## 2021-06-21 DIAGNOSIS — I872 Venous insufficiency (chronic) (peripheral): Secondary | ICD-10-CM | POA: Diagnosis not present

## 2021-06-21 DIAGNOSIS — F411 Generalized anxiety disorder: Secondary | ICD-10-CM | POA: Diagnosis not present

## 2021-06-21 DIAGNOSIS — E559 Vitamin D deficiency, unspecified: Secondary | ICD-10-CM | POA: Diagnosis not present

## 2021-06-21 DIAGNOSIS — F17200 Nicotine dependence, unspecified, uncomplicated: Secondary | ICD-10-CM | POA: Diagnosis not present

## 2021-06-21 DIAGNOSIS — I739 Peripheral vascular disease, unspecified: Secondary | ICD-10-CM | POA: Diagnosis not present

## 2021-06-21 DIAGNOSIS — J42 Unspecified chronic bronchitis: Secondary | ICD-10-CM | POA: Diagnosis not present

## 2021-09-21 DIAGNOSIS — E785 Hyperlipidemia, unspecified: Secondary | ICD-10-CM | POA: Diagnosis not present

## 2021-09-21 DIAGNOSIS — I10 Essential (primary) hypertension: Secondary | ICD-10-CM | POA: Diagnosis not present

## 2021-09-21 DIAGNOSIS — E559 Vitamin D deficiency, unspecified: Secondary | ICD-10-CM | POA: Diagnosis not present

## 2021-09-27 DIAGNOSIS — F17200 Nicotine dependence, unspecified, uncomplicated: Secondary | ICD-10-CM | POA: Diagnosis not present

## 2021-09-27 DIAGNOSIS — E785 Hyperlipidemia, unspecified: Secondary | ICD-10-CM | POA: Diagnosis not present

## 2021-09-27 DIAGNOSIS — M5432 Sciatica, left side: Secondary | ICD-10-CM | POA: Diagnosis not present

## 2021-09-27 DIAGNOSIS — F17210 Nicotine dependence, cigarettes, uncomplicated: Secondary | ICD-10-CM | POA: Diagnosis not present

## 2021-09-27 DIAGNOSIS — I10 Essential (primary) hypertension: Secondary | ICD-10-CM | POA: Diagnosis not present

## 2021-09-27 DIAGNOSIS — Z0001 Encounter for general adult medical examination with abnormal findings: Secondary | ICD-10-CM | POA: Diagnosis not present

## 2021-09-27 DIAGNOSIS — E559 Vitamin D deficiency, unspecified: Secondary | ICD-10-CM | POA: Diagnosis not present

## 2021-09-27 DIAGNOSIS — Z1331 Encounter for screening for depression: Secondary | ICD-10-CM | POA: Diagnosis not present

## 2021-09-27 DIAGNOSIS — I872 Venous insufficiency (chronic) (peripheral): Secondary | ICD-10-CM | POA: Diagnosis not present

## 2021-09-27 DIAGNOSIS — I739 Peripheral vascular disease, unspecified: Secondary | ICD-10-CM | POA: Diagnosis not present

## 2021-09-27 DIAGNOSIS — J42 Unspecified chronic bronchitis: Secondary | ICD-10-CM | POA: Diagnosis not present

## 2021-12-01 DIAGNOSIS — Z85828 Personal history of other malignant neoplasm of skin: Secondary | ICD-10-CM | POA: Diagnosis not present

## 2021-12-01 DIAGNOSIS — D2261 Melanocytic nevi of right upper limb, including shoulder: Secondary | ICD-10-CM | POA: Diagnosis not present

## 2021-12-01 DIAGNOSIS — L570 Actinic keratosis: Secondary | ICD-10-CM | POA: Diagnosis not present

## 2021-12-01 DIAGNOSIS — D2262 Melanocytic nevi of left upper limb, including shoulder: Secondary | ICD-10-CM | POA: Diagnosis not present

## 2021-12-01 DIAGNOSIS — L400 Psoriasis vulgaris: Secondary | ICD-10-CM | POA: Diagnosis not present

## 2021-12-03 DIAGNOSIS — Z79899 Other long term (current) drug therapy: Secondary | ICD-10-CM | POA: Diagnosis not present

## 2021-12-31 DIAGNOSIS — E785 Hyperlipidemia, unspecified: Secondary | ICD-10-CM | POA: Diagnosis not present

## 2021-12-31 DIAGNOSIS — E559 Vitamin D deficiency, unspecified: Secondary | ICD-10-CM | POA: Diagnosis not present

## 2022-01-07 DIAGNOSIS — I739 Peripheral vascular disease, unspecified: Secondary | ICD-10-CM | POA: Diagnosis not present

## 2022-01-07 DIAGNOSIS — F411 Generalized anxiety disorder: Secondary | ICD-10-CM | POA: Diagnosis not present

## 2022-01-07 DIAGNOSIS — I872 Venous insufficiency (chronic) (peripheral): Secondary | ICD-10-CM | POA: Diagnosis not present

## 2022-01-07 DIAGNOSIS — F17200 Nicotine dependence, unspecified, uncomplicated: Secondary | ICD-10-CM | POA: Diagnosis not present

## 2022-01-07 DIAGNOSIS — J42 Unspecified chronic bronchitis: Secondary | ICD-10-CM | POA: Diagnosis not present

## 2022-01-07 DIAGNOSIS — E559 Vitamin D deficiency, unspecified: Secondary | ICD-10-CM | POA: Diagnosis not present

## 2022-01-07 DIAGNOSIS — I10 Essential (primary) hypertension: Secondary | ICD-10-CM | POA: Diagnosis not present

## 2022-01-07 DIAGNOSIS — E785 Hyperlipidemia, unspecified: Secondary | ICD-10-CM | POA: Diagnosis not present

## 2022-01-07 DIAGNOSIS — R1319 Other dysphagia: Secondary | ICD-10-CM | POA: Diagnosis not present

## 2022-01-30 IMAGING — CR DG CHEST 2V
1 series · 2 of 2 positions shown · non-contrast
Comparison: November 06, 2020

CLINICAL DATA: Shortness of breath and chest congestion.

EXAM:
CHEST - 2 VIEW

[Series 1: dg chest 2 view · 0.14mm/px · 2 of 2 slices shown]
[im 1/2]
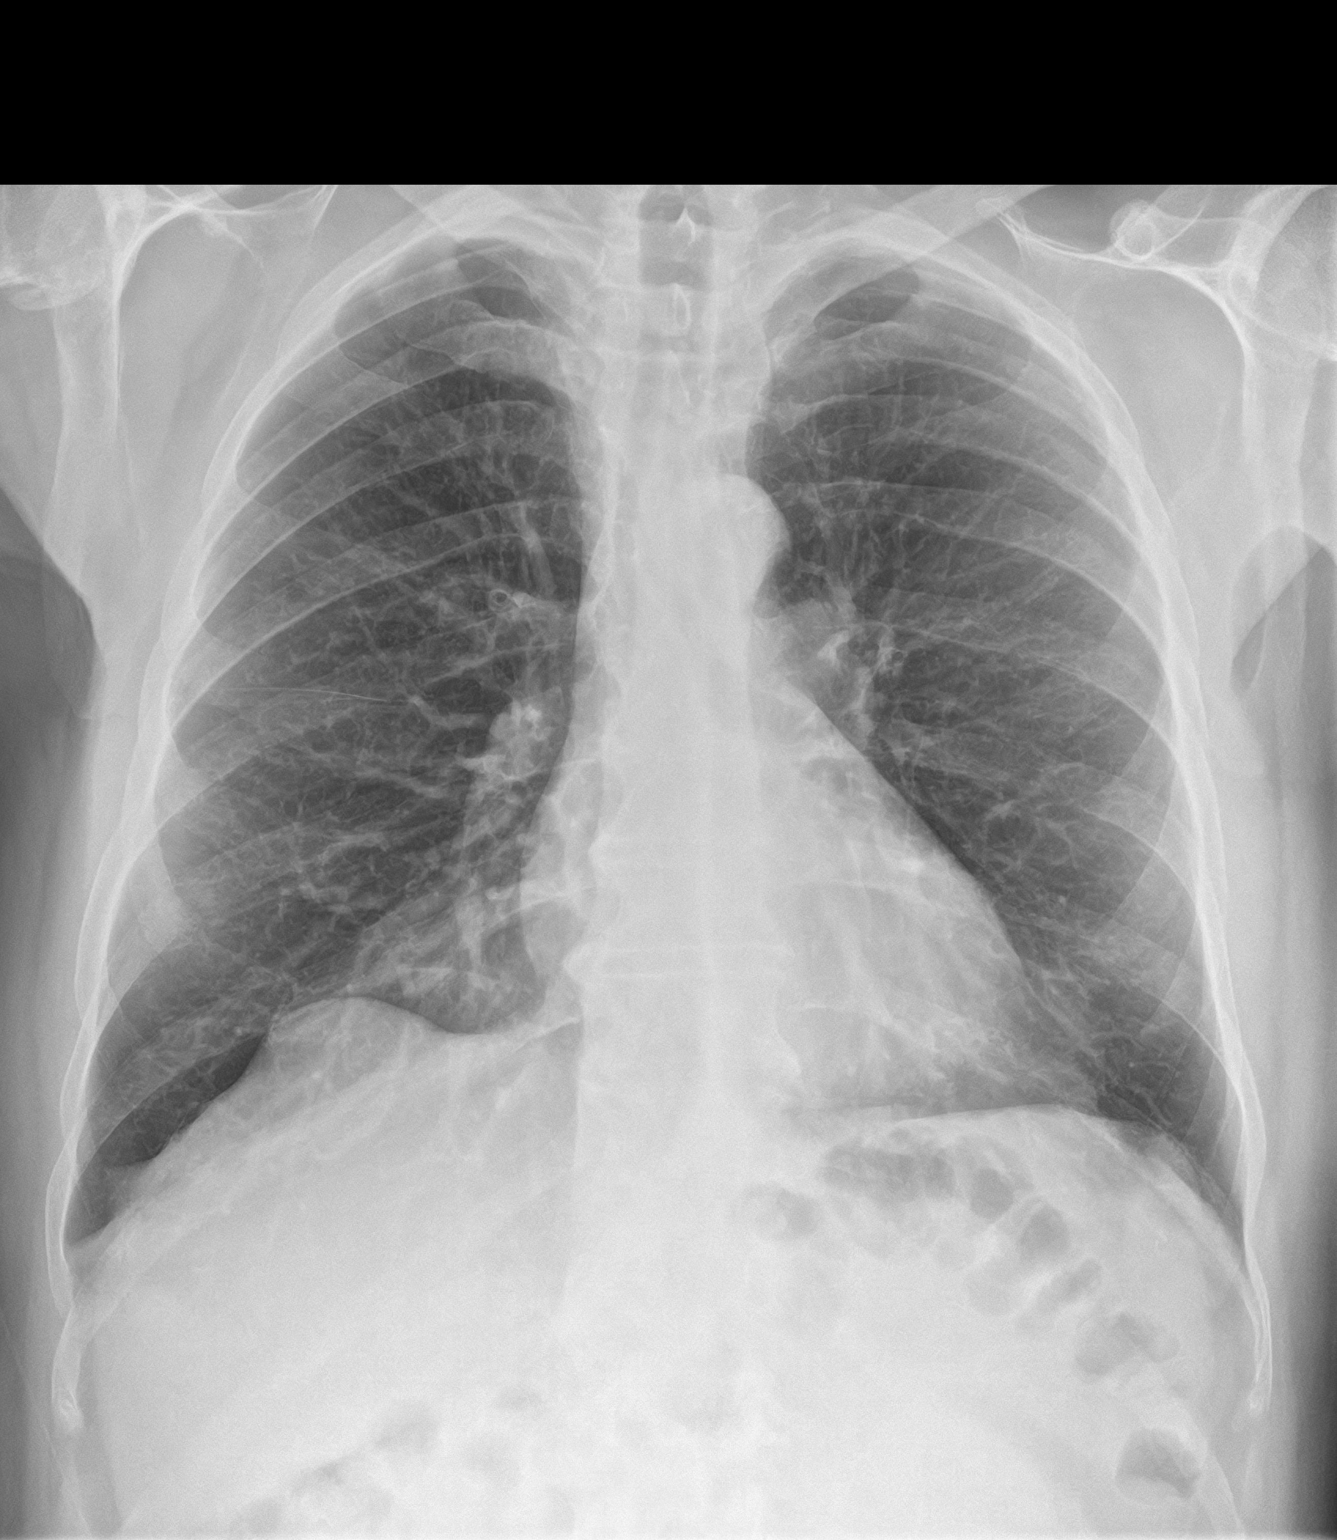
[im 2/2]
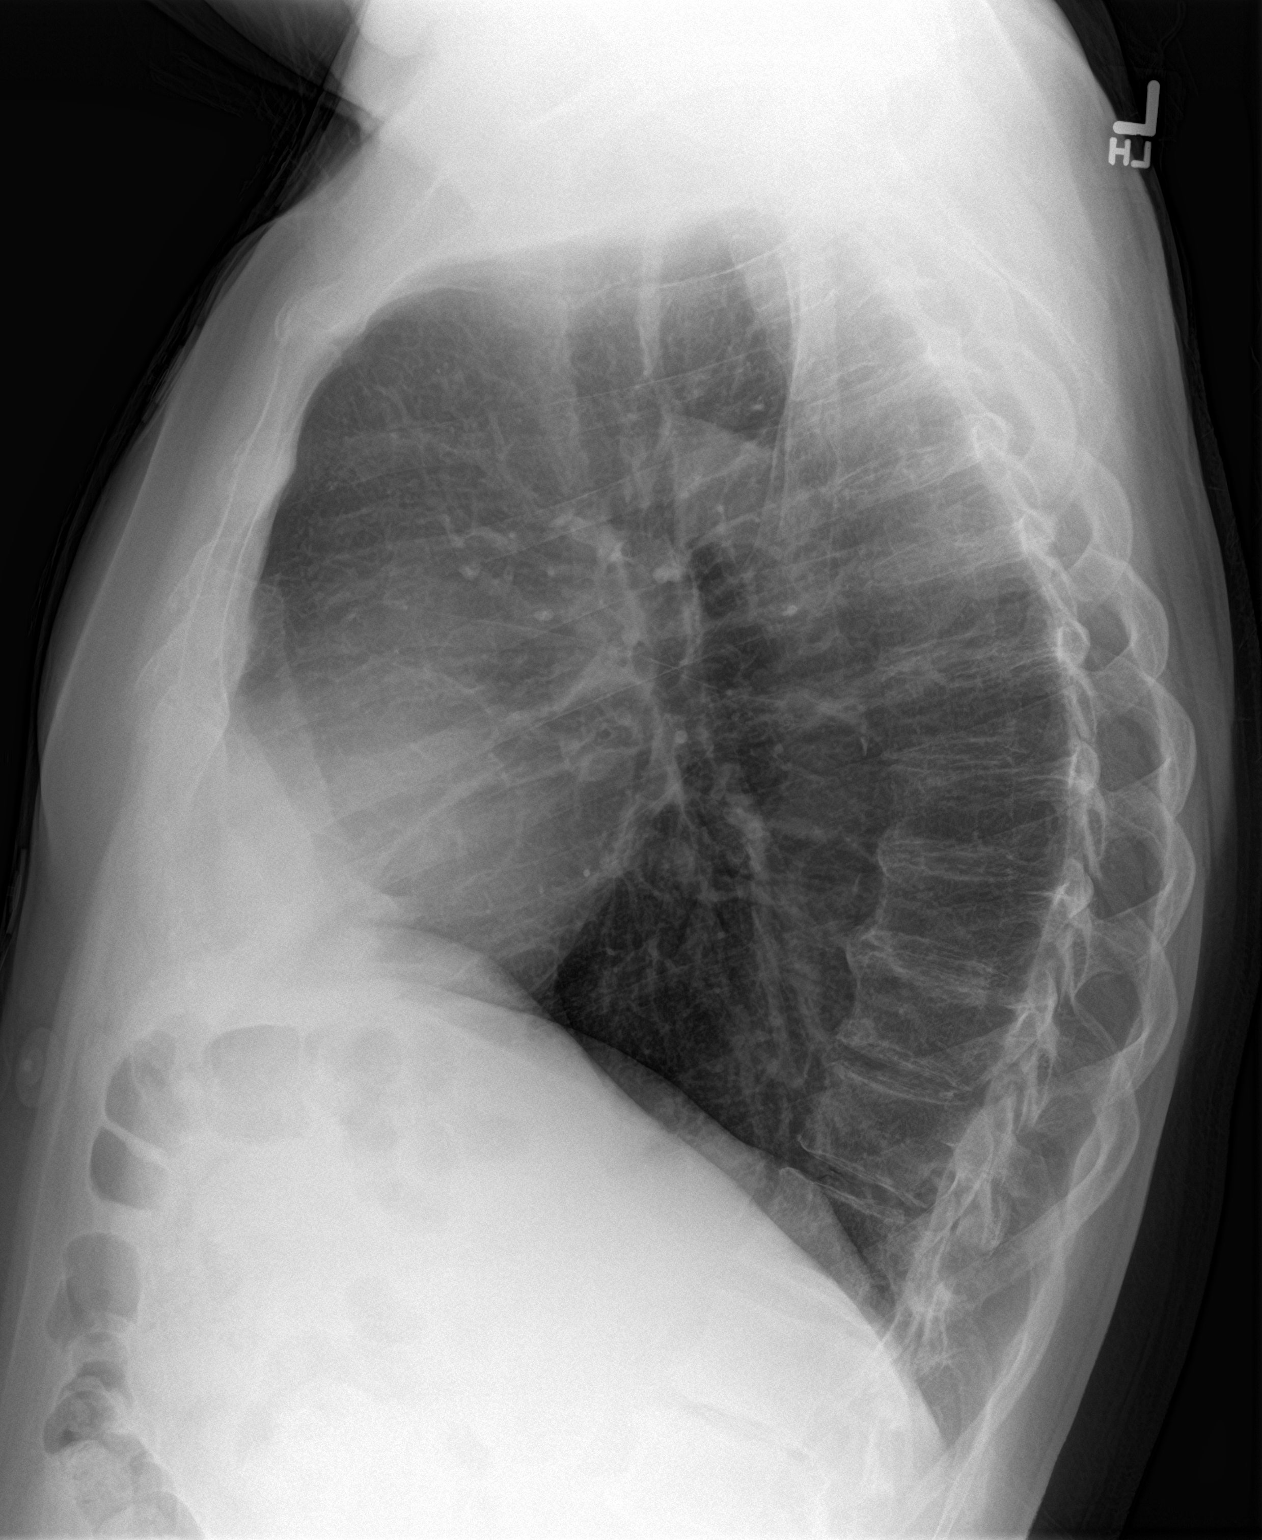

[2 of 2 positions shown; findings below may reference images not displayed]

FINDINGS: The heart size and mediastinal contours are within normal limits.
Both lungs are clear. Degenerative changes are seen involving the
thoracic spine.
IMPRESSION: No active cardiopulmonary disease.

## 2022-04-05 DIAGNOSIS — E785 Hyperlipidemia, unspecified: Secondary | ICD-10-CM | POA: Diagnosis not present

## 2022-04-05 DIAGNOSIS — E559 Vitamin D deficiency, unspecified: Secondary | ICD-10-CM | POA: Diagnosis not present

## 2022-04-12 DIAGNOSIS — F17210 Nicotine dependence, cigarettes, uncomplicated: Secondary | ICD-10-CM | POA: Diagnosis not present

## 2022-04-12 DIAGNOSIS — I10 Essential (primary) hypertension: Secondary | ICD-10-CM | POA: Diagnosis not present

## 2022-04-12 DIAGNOSIS — I739 Peripheral vascular disease, unspecified: Secondary | ICD-10-CM | POA: Diagnosis not present

## 2022-04-12 DIAGNOSIS — E559 Vitamin D deficiency, unspecified: Secondary | ICD-10-CM | POA: Diagnosis not present

## 2022-04-12 DIAGNOSIS — I872 Venous insufficiency (chronic) (peripheral): Secondary | ICD-10-CM | POA: Diagnosis not present

## 2022-04-12 DIAGNOSIS — R1319 Other dysphagia: Secondary | ICD-10-CM | POA: Diagnosis not present

## 2022-04-12 DIAGNOSIS — E785 Hyperlipidemia, unspecified: Secondary | ICD-10-CM | POA: Diagnosis not present

## 2022-04-12 DIAGNOSIS — J42 Unspecified chronic bronchitis: Secondary | ICD-10-CM | POA: Diagnosis not present

## 2022-04-12 DIAGNOSIS — F411 Generalized anxiety disorder: Secondary | ICD-10-CM | POA: Diagnosis not present

## 2022-06-13 DIAGNOSIS — D2262 Melanocytic nevi of left upper limb, including shoulder: Secondary | ICD-10-CM | POA: Diagnosis not present

## 2022-06-13 DIAGNOSIS — L570 Actinic keratosis: Secondary | ICD-10-CM | POA: Diagnosis not present

## 2022-06-13 DIAGNOSIS — D225 Melanocytic nevi of trunk: Secondary | ICD-10-CM | POA: Diagnosis not present

## 2022-06-13 DIAGNOSIS — X32XXXA Exposure to sunlight, initial encounter: Secondary | ICD-10-CM | POA: Diagnosis not present

## 2022-06-13 DIAGNOSIS — D2261 Melanocytic nevi of right upper limb, including shoulder: Secondary | ICD-10-CM | POA: Diagnosis not present

## 2022-06-13 DIAGNOSIS — L400 Psoriasis vulgaris: Secondary | ICD-10-CM | POA: Diagnosis not present

## 2022-06-13 DIAGNOSIS — Z85828 Personal history of other malignant neoplasm of skin: Secondary | ICD-10-CM | POA: Diagnosis not present

## 2022-06-15 DIAGNOSIS — Z79899 Other long term (current) drug therapy: Secondary | ICD-10-CM | POA: Diagnosis not present

## 2022-07-08 DIAGNOSIS — E785 Hyperlipidemia, unspecified: Secondary | ICD-10-CM | POA: Diagnosis not present

## 2022-07-15 DIAGNOSIS — R1319 Other dysphagia: Secondary | ICD-10-CM | POA: Diagnosis not present

## 2022-07-15 DIAGNOSIS — F17200 Nicotine dependence, unspecified, uncomplicated: Secondary | ICD-10-CM | POA: Diagnosis not present

## 2022-07-15 DIAGNOSIS — I739 Peripheral vascular disease, unspecified: Secondary | ICD-10-CM | POA: Diagnosis not present

## 2022-07-15 DIAGNOSIS — I10 Essential (primary) hypertension: Secondary | ICD-10-CM | POA: Diagnosis not present

## 2022-07-15 DIAGNOSIS — E559 Vitamin D deficiency, unspecified: Secondary | ICD-10-CM | POA: Diagnosis not present

## 2022-07-15 DIAGNOSIS — F411 Generalized anxiety disorder: Secondary | ICD-10-CM | POA: Diagnosis not present

## 2022-07-15 DIAGNOSIS — J42 Unspecified chronic bronchitis: Secondary | ICD-10-CM | POA: Diagnosis not present

## 2022-07-15 DIAGNOSIS — I872 Venous insufficiency (chronic) (peripheral): Secondary | ICD-10-CM | POA: Diagnosis not present

## 2022-07-15 DIAGNOSIS — E785 Hyperlipidemia, unspecified: Secondary | ICD-10-CM | POA: Diagnosis not present

## 2022-08-22 ENCOUNTER — Other Ambulatory Visit: Payer: Self-pay | Admitting: Internal Medicine

## 2022-10-18 ENCOUNTER — Ambulatory Visit: Payer: Medicare HMO | Admitting: Internal Medicine

## 2022-11-02 ENCOUNTER — Other Ambulatory Visit: Payer: Medicare HMO

## 2022-11-02 ENCOUNTER — Other Ambulatory Visit: Payer: Self-pay | Admitting: Internal Medicine

## 2022-11-02 DIAGNOSIS — E785 Hyperlipidemia, unspecified: Secondary | ICD-10-CM | POA: Diagnosis not present

## 2022-11-02 DIAGNOSIS — E559 Vitamin D deficiency, unspecified: Secondary | ICD-10-CM | POA: Diagnosis not present

## 2022-11-02 DIAGNOSIS — I10 Essential (primary) hypertension: Secondary | ICD-10-CM | POA: Diagnosis not present

## 2022-11-03 LAB — CBC WITH DIFFERENTIAL/PLATELET
Basophils Absolute: 0.1 10*3/uL (ref 0.0–0.2)
Basos: 1 %
EOS (ABSOLUTE): 0.2 10*3/uL (ref 0.0–0.4)
Eos: 4 %
Hematocrit: 44.4 % (ref 37.5–51.0)
Hemoglobin: 14.8 g/dL (ref 13.0–17.7)
Immature Grans (Abs): 0 10*3/uL (ref 0.0–0.1)
Immature Granulocytes: 0 %
Lymphocytes Absolute: 1.3 10*3/uL (ref 0.7–3.1)
Lymphs: 22 %
MCH: 33 pg (ref 26.6–33.0)
MCHC: 33.3 g/dL (ref 31.5–35.7)
MCV: 99 fL — ABNORMAL HIGH (ref 79–97)
Monocytes Absolute: 0.5 10*3/uL (ref 0.1–0.9)
Monocytes: 9 %
Neutrophils Absolute: 3.7 10*3/uL (ref 1.4–7.0)
Neutrophils: 64 %
Platelets: 179 10*3/uL (ref 150–450)
RBC: 4.48 x10E6/uL (ref 4.14–5.80)
RDW: 13.1 % (ref 11.6–15.4)
WBC: 5.7 10*3/uL (ref 3.4–10.8)

## 2022-11-03 LAB — COMPREHENSIVE METABOLIC PANEL
ALT: 17 IU/L (ref 0–44)
AST: 19 IU/L (ref 0–40)
Albumin/Globulin Ratio: 2 (ref 1.2–2.2)
Albumin: 4.2 g/dL (ref 3.7–4.7)
Alkaline Phosphatase: 72 IU/L (ref 44–121)
BUN/Creatinine Ratio: 17 (ref 10–24)
BUN: 13 mg/dL (ref 8–27)
Bilirubin Total: 0.5 mg/dL (ref 0.0–1.2)
CO2: 23 mmol/L (ref 20–29)
Calcium: 9.3 mg/dL (ref 8.6–10.2)
Chloride: 105 mmol/L (ref 96–106)
Creatinine, Ser: 0.78 mg/dL (ref 0.76–1.27)
Globulin, Total: 2.1 g/dL (ref 1.5–4.5)
Glucose: 96 mg/dL (ref 70–99)
Potassium: 4.5 mmol/L (ref 3.5–5.2)
Sodium: 142 mmol/L (ref 134–144)
Total Protein: 6.3 g/dL (ref 6.0–8.5)
eGFR: 90 mL/min/{1.73_m2} (ref >59)

## 2022-11-03 LAB — LIPID PANEL W/O CHOL/HDL RATIO
Cholesterol, Total: 149 mg/dL (ref 100–199)
HDL: 50 mg/dL (ref >39)
LDL Chol Calc (NIH): 73 mg/dL (ref 0–99)
Triglycerides: 154 mg/dL — ABNORMAL HIGH (ref 0–149)
VLDL Cholesterol Cal: 26 mg/dL (ref 5–40)

## 2022-11-03 LAB — VITAMIN D 25 HYDROXY (VIT D DEFICIENCY, FRACTURES): Vit D, 25-Hydroxy: 62.5 ng/mL (ref 30.0–100.0)

## 2022-11-09 ENCOUNTER — Ambulatory Visit (INDEPENDENT_AMBULATORY_CARE_PROVIDER_SITE_OTHER): Payer: Medicare HMO | Admitting: Internal Medicine

## 2022-11-09 ENCOUNTER — Encounter: Payer: Self-pay | Admitting: Internal Medicine

## 2022-11-09 VITALS — BP 140/70 | HR 82 | Temp 97.4°F | Ht 69.0 in | Wt 169.2 lb

## 2022-11-09 DIAGNOSIS — E782 Mixed hyperlipidemia: Secondary | ICD-10-CM

## 2022-11-09 DIAGNOSIS — R42 Dizziness and giddiness: Secondary | ICD-10-CM

## 2022-11-09 DIAGNOSIS — I10 Essential (primary) hypertension: Secondary | ICD-10-CM | POA: Diagnosis not present

## 2022-11-09 DIAGNOSIS — I951 Orthostatic hypotension: Secondary | ICD-10-CM | POA: Diagnosis not present

## 2022-11-09 DIAGNOSIS — Z0001 Encounter for general adult medical examination with abnormal findings: Secondary | ICD-10-CM | POA: Diagnosis not present

## 2022-11-09 MED ORDER — ATORVASTATIN CALCIUM 10 MG PO TABS: 10.0000 mg | ORAL_TABLET | Freq: Every day | ORAL | 0 refills | Status: DC

## 2022-11-09 MED ORDER — AMLODIPINE BESY-BENAZEPRIL HCL 5-20 MG PO CAPS: 1.0000 | ORAL_CAPSULE | Freq: Every day | ORAL | 0 refills | Status: DC

## 2022-11-09 NOTE — Progress Notes (Signed)
Established Patient Office Visit  Subjective:  Patient ID: Charles Madden, male    DOB: [birth year]  Age: 82 y.o. MRN: [id number]  Chief Complaint  Patient presents with   Follow-up    AWV and 3 month lab results    No new complaints, here for AWV and refer to scanned documents. Reports postural dizziness and denies vertigo although symptoms have coincidentally become worse during the high pollen season. Labs reviewed and notable for  Well controlled lipids     No other concerns at this time.   Past Medical History:  Diagnosis Date   Hyperlipidemia    Hypertension     Past Surgical History:  Procedure Laterality Date   APPENDECTOMY      Social History   Socioeconomic History   Marital status: Widowed    Spouse name: Not on file   Number of children: Not on file   Years of education: Not on file   Highest education level: Not on file  Occupational History   Not on file  Tobacco Use   Smoking status: Every Day    Packs/day: .25    Types: Cigarettes   Smokeless tobacco: Never  Substance and Sexual Activity   Alcohol use: Not Currently   Drug use: Never   Sexual activity: Not on file  Other Topics Concern   Not on file  Social History Narrative   Not on file   Social Determinants of Health   Financial Resource Strain: Not on file  Food Insecurity: Not on file  Transportation Needs: Not on file  Physical Activity: Not on file  Stress: Not on file  Social Connections: Not on file  Intimate Partner Violence: Not on file    No family history on file.  No Known Allergies  Review of Systems  Constitutional: Negative.  Negative for weight loss.  HENT: Negative.  Negative for congestion, ear discharge, ear pain and tinnitus.   Eyes: Negative.   Respiratory: Negative.    Cardiovascular: Negative.   Gastrointestinal: Negative.   Genitourinary: Negative.   Skin: Negative.   Neurological:  Positive for dizziness.  Endo/Heme/Allergies: Negative.         Objective:   BP (!) 140/70   Pulse 82   Temp (!) 97.4 F (36.3 C) (Tympanic)   Ht  (1.753 m)   Wt 169 lb 3.2 oz (76.7 kg)   SpO2 96%   BMI 24.99 kg/m   Vitals:   11/09/22 0934  BP: (!) 140/70  Pulse: 82  Temp: (!) 97.4 F (36.3 C)  Height:  (1.753 m)  Weight: 169 lb 3.2 oz (76.7 kg)  SpO2: 96%  TempSrc: Tympanic  BMI (Calculated): 24.98    Physical Exam Vitals reviewed.  Constitutional:      Appearance: Normal appearance.  HENT:     Head: Normocephalic.     Left Ear: There is no impacted cerumen.     Nose: Nose normal.     Mouth/Throat:     Mouth: Mucous membranes are moist.     Pharynx: No posterior oropharyngeal erythema.  Eyes:     Extraocular Movements: Extraocular movements intact.     Pupils: Pupils are equal, round, and reactive to light.  Cardiovascular:     Rate and Rhythm: Regular rhythm.     Chest Wall: PMI is not displaced.     Pulses: Normal pulses.     Heart sounds: Normal heart sounds. No murmur heard. Pulmonary:     Effort: Pulmonary  effort is normal.     Breath sounds: Normal air entry. No rhonchi or rales.  Abdominal:     General: Abdomen is flat. Bowel sounds are normal. There is no distension.     Palpations: Abdomen is soft. There is no hepatomegaly, splenomegaly or mass.     Tenderness: There is no abdominal tenderness.  Musculoskeletal:        General: Normal range of motion.     Cervical back: Normal range of motion and neck supple.     Right lower leg: No edema.     Left lower leg: No edema.  Skin:    General: Skin is warm and dry.  Neurological:     General: No focal deficit present.     Mental Status: He is alert and oriented to person, place, and time.     Cranial Nerves: No cranial nerve deficit.     Motor: No weakness.  Psychiatric:        Mood and Affect: Mood normal.        Behavior: Behavior normal.      No results found for any visits on 11/09/22.  Recent Results (from the past 2160 hour(Jermani Eberlein))  CBC  with Differential/Platelet     Status: Abnormal   Collection Time: 11/02/22  9:19 AM  Result Value Ref Range   WBC 5.7 3.4 - 10.8 x10E3/uL   RBC 4.48 4.14 - 5.80 x10E6/uL   Hemoglobin 14.8 13.0 - 17.7 g/dL   Hematocrit 16.1 09.6 - 51.0 %   MCV 99 (H) 79 - 97 fL   MCH 33.0 26.6 - 33.0 pg   MCHC 33.3 31.5 - 35.7 g/dL   RDW 04.5 40.9 - 81.1 %   Platelets 179 150 - 450 x10E3/uL   Neutrophils 64 Not Estab. %   Lymphs 22 Not Estab. %   Monocytes 9 Not Estab. %   Eos 4 Not Estab. %   Basos 1 Not Estab. %   Neutrophils Absolute 3.7 1.4 - 7.0 x10E3/uL   Lymphocytes Absolute 1.3 0.7 - 3.1 x10E3/uL   Monocytes Absolute 0.5 0.1 - 0.9 x10E3/uL   EOS (ABSOLUTE) 0.2 0.0 - 0.4 x10E3/uL   Basophils Absolute 0.1 0.0 - 0.2 x10E3/uL   Immature Granulocytes 0 Not Estab. %   Immature Grans (Abs) 0.0 0.0 - 0.1 x10E3/uL  Comprehensive metabolic panel     Status: None   Collection Time: 11/02/22  9:19 AM  Result Value Ref Range   Glucose 96 70 - 99 mg/dL   BUN 13 8 - 27 mg/dL   Creatinine, Ser 9.14 0.76 - 1.27 mg/dL   eGFR 90 >78 GN/FAO/1.30   BUN/Creatinine Ratio 17 10 - 24   Sodium 142 134 - 144 mmol/L   Potassium 4.5 3.5 - 5.2 mmol/L   Chloride 105 96 - 106 mmol/L   CO2 23 20 - 29 mmol/L   Calcium 9.3 8.6 - 10.2 mg/dL   Total Protein 6.3 6.0 - 8.5 g/dL   Albumin 4.2 3.7 - 4.7 g/dL   Globulin, Total 2.1 1.5 - 4.5 g/dL   Albumin/Globulin Ratio 2.0 1.2 - 2.2   Bilirubin Total 0.5 0.0 - 1.2 mg/dL   Alkaline Phosphatase 72 44 - 121 IU/L   AST 19 0 - 40 IU/L   ALT 17 0 - 44 IU/L  Lipid Panel w/o Chol/HDL Ratio     Status: Abnormal   Collection Time: 11/02/22  9:19 AM  Result Value Ref Range   Cholesterol, Total 149 100 -  199 mg/dL   Triglycerides 161 (H) 0 - 149 mg/dL   HDL 50 >09 mg/dL   VLDL Cholesterol Cal 26 5 - 40 mg/dL   LDL Chol Calc (NIH) 73 0 - 99 mg/dL  VITAMIN D 25 Hydroxy (Vit-D Deficiency, Fractures)     Status: None   Collection Time: 11/02/22  9:19 AM  Result Value Ref Range    Vit D, 25-Hydroxy 62.5 30.0 - 100.0 ng/mL    Comment: Vitamin D deficiency has been defined by the Institute of Medicine and an Endocrine Society practice guideline as a level of serum 25-OH vitamin D less than 20 ng/mL (1,2). The Endocrine Society went on to further define vitamin D insufficiency as a level between 21 and 29 ng/mL (2). 1. IOM (Institute of Medicine). 2010. Dietary reference    intakes for calcium and D. [place]: The    [employer]. 2. Holick MF, Binkley [place], Bischoff-Ferrari HA, et al.    Evaluation, treatment, and prevention of vitamin D    deficiency: an Endocrine Society clinical practice    guideline. JCEM. 2011 Jul; 96(7):1911-30.       Assessment & Plan:   Problem List Items Addressed This Visit   None Visit Diagnoses     Dizziness    -  Primary   Mixed hyperlipidemia       Relevant Medications   amLODipine-benazepril (LOTREL) 5-20 MG capsule   atorvastatin (LIPITOR) 10 MG tablet   Primary hypertension       Relevant Medications   amLODipine-benazepril (LOTREL) 5-20 MG capsule   atorvastatin (LIPITOR) 10 MG tablet   Orthostatic hypotension       Relevant Medications   amLODipine-benazepril (LOTREL) 5-20 MG capsule   atorvastatin (LIPITOR) 10 MG tablet     Administer IV fluids  Return in about 3 months (around 02/08/2023) for fu with labs prior.   Total time spent: 35 minutes  Luna Fuse, MD  11/09/2022

## 2022-12-05 ENCOUNTER — Other Ambulatory Visit: Payer: Self-pay | Admitting: Internal Medicine

## 2022-12-05 DIAGNOSIS — I10 Essential (primary) hypertension: Secondary | ICD-10-CM

## 2022-12-20 DIAGNOSIS — D2262 Melanocytic nevi of left upper limb, including shoulder: Secondary | ICD-10-CM | POA: Diagnosis not present

## 2022-12-20 DIAGNOSIS — D0439 Carcinoma in situ of skin of other parts of face: Secondary | ICD-10-CM | POA: Diagnosis not present

## 2022-12-20 DIAGNOSIS — L400 Psoriasis vulgaris: Secondary | ICD-10-CM | POA: Diagnosis not present

## 2022-12-20 DIAGNOSIS — L821 Other seborrheic keratosis: Secondary | ICD-10-CM | POA: Diagnosis not present

## 2022-12-20 DIAGNOSIS — D225 Melanocytic nevi of trunk: Secondary | ICD-10-CM | POA: Diagnosis not present

## 2022-12-20 DIAGNOSIS — D2261 Melanocytic nevi of right upper limb, including shoulder: Secondary | ICD-10-CM | POA: Diagnosis not present

## 2022-12-20 DIAGNOSIS — D2272 Melanocytic nevi of left lower limb, including hip: Secondary | ICD-10-CM | POA: Diagnosis not present

## 2022-12-20 DIAGNOSIS — Z85828 Personal history of other malignant neoplasm of skin: Secondary | ICD-10-CM | POA: Diagnosis not present

## 2022-12-20 DIAGNOSIS — D2271 Melanocytic nevi of right lower limb, including hip: Secondary | ICD-10-CM | POA: Diagnosis not present

## 2022-12-20 DIAGNOSIS — Z08 Encounter for follow-up examination after completed treatment for malignant neoplasm: Secondary | ICD-10-CM | POA: Diagnosis not present

## 2022-12-26 DIAGNOSIS — Z79899 Other long term (current) drug therapy: Secondary | ICD-10-CM | POA: Diagnosis not present

## 2023-02-01 ENCOUNTER — Other Ambulatory Visit: Payer: Self-pay | Admitting: Internal Medicine

## 2023-02-01 ENCOUNTER — Other Ambulatory Visit: Payer: Medicare HMO

## 2023-02-01 DIAGNOSIS — E782 Mixed hyperlipidemia: Secondary | ICD-10-CM | POA: Diagnosis not present

## 2023-02-02 LAB — HEPATIC FUNCTION PANEL
ALT: 19 IU/L (ref 0–44)
AST: 21 IU/L (ref 0–40)
Albumin: 4.2 g/dL (ref 3.7–4.7)
Alkaline Phosphatase: 68 IU/L (ref 44–121)
Bilirubin Total: 0.5 mg/dL (ref 0.0–1.2)
Bilirubin, Direct: 0.15 mg/dL (ref 0.00–0.40)
Total Protein: 6.3 g/dL (ref 6.0–8.5)

## 2023-02-02 LAB — LIPID PANEL
Chol/HDL Ratio: 3 ratio (ref 0.0–5.0)
Cholesterol, Total: 128 mg/dL (ref 100–199)
HDL: 43 mg/dL (ref >39)
LDL Chol Calc (NIH): 65 mg/dL (ref 0–99)
Triglycerides: 111 mg/dL (ref 0–149)
VLDL Cholesterol Cal: 20 mg/dL (ref 5–40)

## 2023-02-02 LAB — CK: Total CK: 117 U/L (ref 30–208)

## 2023-02-08 ENCOUNTER — Encounter: Payer: Self-pay | Admitting: Internal Medicine

## 2023-02-08 ENCOUNTER — Ambulatory Visit (INDEPENDENT_AMBULATORY_CARE_PROVIDER_SITE_OTHER): Payer: Medicare HMO | Admitting: Internal Medicine

## 2023-02-08 VITALS — BP 118/70 | HR 64 | Ht 69.0 in | Wt 172.0 lb

## 2023-02-08 DIAGNOSIS — I872 Venous insufficiency (chronic) (peripheral): Secondary | ICD-10-CM | POA: Diagnosis not present

## 2023-02-08 DIAGNOSIS — E782 Mixed hyperlipidemia: Secondary | ICD-10-CM | POA: Diagnosis not present

## 2023-02-08 DIAGNOSIS — I10 Essential (primary) hypertension: Secondary | ICD-10-CM

## 2023-02-08 DIAGNOSIS — I951 Orthostatic hypotension: Secondary | ICD-10-CM

## 2023-02-08 MED ORDER — ATORVASTATIN CALCIUM 10 MG PO TABS: 10.0000 mg | ORAL_TABLET | Freq: Every day | ORAL | 1 refills | Status: DC

## 2023-02-08 NOTE — Progress Notes (Signed)
Established Patient Office Visit  Subjective:  Patient ID: Charles Madden, male    DOB: 1941/06/27  Age: 82 y.o. MRN: [id number]  Chief Complaint  Patient presents with   Follow-up    3 month follow up, discuss lab results.    No new complaints, here for lab review and medication refills. LDL and TC well controlled on lab review. Triglycerides also improved and satisfactory.     No other concerns at this time.   Past Medical History:  Diagnosis Date   Hyperlipidemia    Hypertension     Past Surgical History:  Procedure Laterality Date   APPENDECTOMY      Social History   Socioeconomic History   Marital status: Widowed    Spouse name: Not on file   Number of children: Not on file   Years of education: Not on file   Highest education level: Not on file  Occupational History   Not on file  Tobacco Use   Smoking status: Every Day    Current packs/day: 0.25    Types: Cigarettes   Smokeless tobacco: Never  Substance and Sexual Activity   Alcohol use: Not Currently   Drug use: Never   Sexual activity: Not on file  Other Topics Concern   Not on file  Social History Narrative   Not on file   Social Determinants of Health   Financial Resource Strain: Not on file  Food Insecurity: Not on file  Transportation Needs: Not on file  Physical Activity: Not on file  Stress: Not on file  Social Connections: Not on file  Intimate Partner Violence: Not on file    No family history on file.  No Known Allergies  Review of Systems  Constitutional: Negative.  Negative for weight loss.  HENT: Negative.  Negative for congestion, ear discharge, ear pain and tinnitus.   Eyes: Negative.   Respiratory: Negative.    Cardiovascular: Negative.   Gastrointestinal: Negative.   Genitourinary: Negative.   Skin: Negative.   Neurological:  Positive for dizziness.  Endo/Heme/Allergies: Negative.        Objective:   BP 118/70   Pulse 64   Ht 5\' 9"  (1.753 m)   Wt 172 lb  (78 kg)   SpO2 96%   BMI 25.40 kg/m   Vitals:   02/08/23 0917  BP: 118/70  Pulse: 64  Height: 5\' 9"  (1.753 m)  Weight: 172 lb (78 kg)  SpO2: 96%  BMI (Calculated): 25.39    Physical Exam Vitals reviewed.  Constitutional:      Appearance: Normal appearance.  HENT:     Head: Normocephalic.     Left Ear: There is no impacted cerumen.     Nose: Nose normal.     Mouth/Throat:     Mouth: Mucous membranes are moist.     Pharynx: No posterior oropharyngeal erythema.  Eyes:     Extraocular Movements: Extraocular movements intact.     Pupils: Pupils are equal, round, and reactive to light.  Cardiovascular:     Rate and Rhythm: Regular rhythm.     Chest Wall: PMI is not displaced.     Pulses: Normal pulses.     Heart sounds: Normal heart sounds. No murmur heard. Pulmonary:     Effort: Pulmonary effort is normal.     Breath sounds: Normal air entry. No rhonchi or rales.  Abdominal:     General: Abdomen is flat. Bowel sounds are normal. There is no distension.  Palpations: Abdomen is soft. There is no hepatomegaly, splenomegaly or mass.     Tenderness: There is no abdominal tenderness.  Musculoskeletal:        General: Normal range of motion.     Cervical back: Normal range of motion and neck supple.     Right lower leg: 1+ Pitting Edema present.     Left lower leg: 1+ Edema present.  Skin:    General: Skin is warm and dry.     Comments: Hemosiderin staining of both legs  Neurological:     General: No focal deficit present.     Mental Status: He is alert and oriented to person, place, and time.     Cranial Nerves: No cranial nerve deficit.     Motor: No weakness.  Psychiatric:        Mood and Affect: Mood normal.        Behavior: Behavior normal.      No results found for any visits on 02/08/23.  Recent Results (from the past 2160 hour(Danicka Hourihan))  Lipid panel     Status: None   Collection Time: 02/01/23 10:25 AM  Result Value Ref Range   Cholesterol, Total 128 100 -  199 mg/dL   Triglycerides 409 0 - 149 mg/dL   HDL 43 >81 mg/dL   VLDL Cholesterol Cal 20 5 - 40 mg/dL   LDL Chol Calc (NIH) 65 0 - 99 mg/dL   Chol/HDL Ratio 3.0 0.0 - 5.0 ratio    Comment:                                   T. Chol/HDL Ratio                                             Men  Women                               1/2 Avg.Risk  3.4    3.3                                   Avg.Risk  5.0    4.4                                2X Avg.Risk  9.6    7.1                                3X Avg.Risk 23.4   11.0   Hepatic function panel     Status: None   Collection Time: 02/01/23 10:25 AM  Result Value Ref Range   Total Protein 6.3 6.0 - 8.5 g/dL   Albumin 4.2 3.7 - 4.7 g/dL   Bilirubin Total 0.5 0.0 - 1.2 mg/dL   Bilirubin, Direct 1.91 0.00 - 0.40 mg/dL   Alkaline Phosphatase 68 44 - 121 IU/L   AST 21 0 - 40 IU/L   ALT 19 0 - 44 IU/L  CK     Status: None   Collection Time: 02/01/23 10:25 AM  Result Value Ref Range  Total CK 117 30 - 208 U/L      Assessment & Plan:  As per problem list. Keep legs elevated and may require compression stockings. Problem List Items Addressed This Visit       Cardiovascular and Mediastinum   Primary hypertension   Relevant Medications   atorvastatin (LIPITOR) 10 MG tablet   Venous insufficiency of both lower extremities   Relevant Medications   atorvastatin (LIPITOR) 10 MG tablet     Other   Mixed hyperlipidemia - Primary   Relevant Medications   atorvastatin (LIPITOR) 10 MG tablet   Other Visit Diagnoses     Orthostatic hypotension       Relevant Medications   atorvastatin (LIPITOR) 10 MG tablet       Return in about 3 months (around 05/11/2023) for fu with labs prior.   Total time spent: 30 minutes  Luna Fuse, MD  02/08/2023   This document may have been prepared by [facility] Voice Recognition software and as such may include unintentional dictation errors.

## 2023-02-16 DIAGNOSIS — D0439 Carcinoma in situ of skin of other parts of face: Secondary | ICD-10-CM | POA: Diagnosis not present

## 2023-02-16 DIAGNOSIS — L905 Scar conditions and fibrosis of skin: Secondary | ICD-10-CM | POA: Diagnosis not present

## 2023-03-15 DIAGNOSIS — M5441 Lumbago with sciatica, right side: Secondary | ICD-10-CM | POA: Diagnosis not present

## 2023-03-15 DIAGNOSIS — R202 Paresthesia of skin: Secondary | ICD-10-CM | POA: Diagnosis not present

## 2023-03-15 DIAGNOSIS — M5136 Other intervertebral disc degeneration, lumbar region: Secondary | ICD-10-CM | POA: Diagnosis not present

## 2023-03-15 DIAGNOSIS — M9903 Segmental and somatic dysfunction of lumbar region: Secondary | ICD-10-CM | POA: Diagnosis not present

## 2023-03-15 DIAGNOSIS — M7918 Myalgia, other site: Secondary | ICD-10-CM | POA: Diagnosis not present

## 2023-03-15 DIAGNOSIS — M5137 Other intervertebral disc degeneration, lumbosacral region: Secondary | ICD-10-CM | POA: Diagnosis not present

## 2023-03-15 DIAGNOSIS — M9904 Segmental and somatic dysfunction of sacral region: Secondary | ICD-10-CM | POA: Diagnosis not present

## 2023-03-16 DIAGNOSIS — M5136 Other intervertebral disc degeneration, lumbar region: Secondary | ICD-10-CM | POA: Diagnosis not present

## 2023-03-16 DIAGNOSIS — M9904 Segmental and somatic dysfunction of sacral region: Secondary | ICD-10-CM | POA: Diagnosis not present

## 2023-03-16 DIAGNOSIS — M9903 Segmental and somatic dysfunction of lumbar region: Secondary | ICD-10-CM | POA: Diagnosis not present

## 2023-03-16 DIAGNOSIS — M7918 Myalgia, other site: Secondary | ICD-10-CM | POA: Diagnosis not present

## 2023-03-16 DIAGNOSIS — R202 Paresthesia of skin: Secondary | ICD-10-CM | POA: Diagnosis not present

## 2023-03-16 DIAGNOSIS — M5441 Lumbago with sciatica, right side: Secondary | ICD-10-CM | POA: Diagnosis not present

## 2023-03-16 DIAGNOSIS — M5137 Other intervertebral disc degeneration, lumbosacral region: Secondary | ICD-10-CM | POA: Diagnosis not present

## 2023-03-21 DIAGNOSIS — M5441 Lumbago with sciatica, right side: Secondary | ICD-10-CM | POA: Diagnosis not present

## 2023-03-21 DIAGNOSIS — M9904 Segmental and somatic dysfunction of sacral region: Secondary | ICD-10-CM | POA: Diagnosis not present

## 2023-03-21 DIAGNOSIS — M9903 Segmental and somatic dysfunction of lumbar region: Secondary | ICD-10-CM | POA: Diagnosis not present

## 2023-03-21 DIAGNOSIS — M5137 Other intervertebral disc degeneration, lumbosacral region: Secondary | ICD-10-CM | POA: Diagnosis not present

## 2023-03-21 DIAGNOSIS — M5136 Other intervertebral disc degeneration, lumbar region: Secondary | ICD-10-CM | POA: Diagnosis not present

## 2023-03-21 DIAGNOSIS — R202 Paresthesia of skin: Secondary | ICD-10-CM | POA: Diagnosis not present

## 2023-03-21 DIAGNOSIS — M7918 Myalgia, other site: Secondary | ICD-10-CM | POA: Diagnosis not present

## 2023-03-22 DIAGNOSIS — M9903 Segmental and somatic dysfunction of lumbar region: Secondary | ICD-10-CM | POA: Diagnosis not present

## 2023-03-22 DIAGNOSIS — M5137 Other intervertebral disc degeneration, lumbosacral region: Secondary | ICD-10-CM | POA: Diagnosis not present

## 2023-03-22 DIAGNOSIS — M7918 Myalgia, other site: Secondary | ICD-10-CM | POA: Diagnosis not present

## 2023-03-22 DIAGNOSIS — M5441 Lumbago with sciatica, right side: Secondary | ICD-10-CM | POA: Diagnosis not present

## 2023-03-22 DIAGNOSIS — M9904 Segmental and somatic dysfunction of sacral region: Secondary | ICD-10-CM | POA: Diagnosis not present

## 2023-03-22 DIAGNOSIS — R202 Paresthesia of skin: Secondary | ICD-10-CM | POA: Diagnosis not present

## 2023-03-22 DIAGNOSIS — M5136 Other intervertebral disc degeneration, lumbar region: Secondary | ICD-10-CM | POA: Diagnosis not present

## 2023-03-23 DIAGNOSIS — M9903 Segmental and somatic dysfunction of lumbar region: Secondary | ICD-10-CM | POA: Diagnosis not present

## 2023-03-23 DIAGNOSIS — R202 Paresthesia of skin: Secondary | ICD-10-CM | POA: Diagnosis not present

## 2023-03-23 DIAGNOSIS — M9904 Segmental and somatic dysfunction of sacral region: Secondary | ICD-10-CM | POA: Diagnosis not present

## 2023-03-23 DIAGNOSIS — M5441 Lumbago with sciatica, right side: Secondary | ICD-10-CM | POA: Diagnosis not present

## 2023-03-27 DIAGNOSIS — M5441 Lumbago with sciatica, right side: Secondary | ICD-10-CM | POA: Diagnosis not present

## 2023-03-27 DIAGNOSIS — M7918 Myalgia, other site: Secondary | ICD-10-CM | POA: Diagnosis not present

## 2023-03-27 DIAGNOSIS — M9903 Segmental and somatic dysfunction of lumbar region: Secondary | ICD-10-CM | POA: Diagnosis not present

## 2023-03-27 DIAGNOSIS — M9904 Segmental and somatic dysfunction of sacral region: Secondary | ICD-10-CM | POA: Diagnosis not present

## 2023-03-27 DIAGNOSIS — R202 Paresthesia of skin: Secondary | ICD-10-CM | POA: Diagnosis not present

## 2023-03-27 DIAGNOSIS — M5137 Other intervertebral disc degeneration, lumbosacral region: Secondary | ICD-10-CM | POA: Diagnosis not present

## 2023-03-29 DIAGNOSIS — R202 Paresthesia of skin: Secondary | ICD-10-CM | POA: Diagnosis not present

## 2023-03-29 DIAGNOSIS — M5137 Other intervertebral disc degeneration, lumbosacral region: Secondary | ICD-10-CM | POA: Diagnosis not present

## 2023-03-29 DIAGNOSIS — M9903 Segmental and somatic dysfunction of lumbar region: Secondary | ICD-10-CM | POA: Diagnosis not present

## 2023-03-29 DIAGNOSIS — M5441 Lumbago with sciatica, right side: Secondary | ICD-10-CM | POA: Diagnosis not present

## 2023-03-29 DIAGNOSIS — M5136 Other intervertebral disc degeneration, lumbar region: Secondary | ICD-10-CM | POA: Diagnosis not present

## 2023-03-29 DIAGNOSIS — M9904 Segmental and somatic dysfunction of sacral region: Secondary | ICD-10-CM | POA: Diagnosis not present

## 2023-03-29 DIAGNOSIS — M7918 Myalgia, other site: Secondary | ICD-10-CM | POA: Diagnosis not present

## 2023-03-31 DIAGNOSIS — R202 Paresthesia of skin: Secondary | ICD-10-CM | POA: Diagnosis not present

## 2023-03-31 DIAGNOSIS — M9903 Segmental and somatic dysfunction of lumbar region: Secondary | ICD-10-CM | POA: Diagnosis not present

## 2023-03-31 DIAGNOSIS — M5137 Other intervertebral disc degeneration, lumbosacral region: Secondary | ICD-10-CM | POA: Diagnosis not present

## 2023-03-31 DIAGNOSIS — M7918 Myalgia, other site: Secondary | ICD-10-CM | POA: Diagnosis not present

## 2023-03-31 DIAGNOSIS — M5136 Other intervertebral disc degeneration, lumbar region: Secondary | ICD-10-CM | POA: Diagnosis not present

## 2023-03-31 DIAGNOSIS — M5441 Lumbago with sciatica, right side: Secondary | ICD-10-CM | POA: Diagnosis not present

## 2023-03-31 DIAGNOSIS — M9904 Segmental and somatic dysfunction of sacral region: Secondary | ICD-10-CM | POA: Diagnosis not present

## 2023-04-03 ENCOUNTER — Other Ambulatory Visit: Payer: Self-pay

## 2023-04-03 DIAGNOSIS — M5137 Other intervertebral disc degeneration, lumbosacral region: Secondary | ICD-10-CM | POA: Diagnosis not present

## 2023-04-03 DIAGNOSIS — M5441 Lumbago with sciatica, right side: Secondary | ICD-10-CM | POA: Diagnosis not present

## 2023-04-03 DIAGNOSIS — M7918 Myalgia, other site: Secondary | ICD-10-CM | POA: Diagnosis not present

## 2023-04-03 DIAGNOSIS — R202 Paresthesia of skin: Secondary | ICD-10-CM | POA: Diagnosis not present

## 2023-04-03 DIAGNOSIS — M5136 Other intervertebral disc degeneration, lumbar region: Secondary | ICD-10-CM | POA: Diagnosis not present

## 2023-04-03 DIAGNOSIS — M9904 Segmental and somatic dysfunction of sacral region: Secondary | ICD-10-CM | POA: Diagnosis not present

## 2023-04-03 DIAGNOSIS — M9903 Segmental and somatic dysfunction of lumbar region: Secondary | ICD-10-CM | POA: Diagnosis not present

## 2023-04-03 MED ORDER — ALBUTEROL SULFATE HFA 108 (90 BASE) MCG/ACT IN AERS: 2.0000 | INHALATION_SPRAY | RESPIRATORY_TRACT | 0 refills | Status: DC | PRN

## 2023-04-05 DIAGNOSIS — M5441 Lumbago with sciatica, right side: Secondary | ICD-10-CM | POA: Diagnosis not present

## 2023-04-05 DIAGNOSIS — M5137 Other intervertebral disc degeneration, lumbosacral region: Secondary | ICD-10-CM | POA: Diagnosis not present

## 2023-04-05 DIAGNOSIS — M9904 Segmental and somatic dysfunction of sacral region: Secondary | ICD-10-CM | POA: Diagnosis not present

## 2023-04-05 DIAGNOSIS — R202 Paresthesia of skin: Secondary | ICD-10-CM | POA: Diagnosis not present

## 2023-04-05 DIAGNOSIS — M5136 Other intervertebral disc degeneration, lumbar region: Secondary | ICD-10-CM | POA: Diagnosis not present

## 2023-04-05 DIAGNOSIS — M9903 Segmental and somatic dysfunction of lumbar region: Secondary | ICD-10-CM | POA: Diagnosis not present

## 2023-04-05 DIAGNOSIS — M7918 Myalgia, other site: Secondary | ICD-10-CM | POA: Diagnosis not present

## 2023-04-07 DIAGNOSIS — M5136 Other intervertebral disc degeneration, lumbar region: Secondary | ICD-10-CM | POA: Diagnosis not present

## 2023-04-07 DIAGNOSIS — M5137 Other intervertebral disc degeneration, lumbosacral region: Secondary | ICD-10-CM | POA: Diagnosis not present

## 2023-04-07 DIAGNOSIS — M7918 Myalgia, other site: Secondary | ICD-10-CM | POA: Diagnosis not present

## 2023-04-07 DIAGNOSIS — R202 Paresthesia of skin: Secondary | ICD-10-CM | POA: Diagnosis not present

## 2023-04-07 DIAGNOSIS — M5441 Lumbago with sciatica, right side: Secondary | ICD-10-CM | POA: Diagnosis not present

## 2023-04-07 DIAGNOSIS — M9903 Segmental and somatic dysfunction of lumbar region: Secondary | ICD-10-CM | POA: Diagnosis not present

## 2023-04-07 DIAGNOSIS — M9904 Segmental and somatic dysfunction of sacral region: Secondary | ICD-10-CM | POA: Diagnosis not present

## 2023-04-11 DIAGNOSIS — R202 Paresthesia of skin: Secondary | ICD-10-CM | POA: Diagnosis not present

## 2023-04-11 DIAGNOSIS — M5136 Other intervertebral disc degeneration, lumbar region: Secondary | ICD-10-CM | POA: Diagnosis not present

## 2023-04-11 DIAGNOSIS — M7918 Myalgia, other site: Secondary | ICD-10-CM | POA: Diagnosis not present

## 2023-04-11 DIAGNOSIS — M5137 Other intervertebral disc degeneration, lumbosacral region: Secondary | ICD-10-CM | POA: Diagnosis not present

## 2023-04-11 DIAGNOSIS — M9903 Segmental and somatic dysfunction of lumbar region: Secondary | ICD-10-CM | POA: Diagnosis not present

## 2023-04-11 DIAGNOSIS — M5441 Lumbago with sciatica, right side: Secondary | ICD-10-CM | POA: Diagnosis not present

## 2023-04-11 DIAGNOSIS — M9904 Segmental and somatic dysfunction of sacral region: Secondary | ICD-10-CM | POA: Diagnosis not present

## 2023-04-13 DIAGNOSIS — M9903 Segmental and somatic dysfunction of lumbar region: Secondary | ICD-10-CM | POA: Diagnosis not present

## 2023-04-13 DIAGNOSIS — R202 Paresthesia of skin: Secondary | ICD-10-CM | POA: Diagnosis not present

## 2023-04-13 DIAGNOSIS — M5137 Other intervertebral disc degeneration, lumbosacral region: Secondary | ICD-10-CM | POA: Diagnosis not present

## 2023-04-13 DIAGNOSIS — M9904 Segmental and somatic dysfunction of sacral region: Secondary | ICD-10-CM | POA: Diagnosis not present

## 2023-04-13 DIAGNOSIS — M7918 Myalgia, other site: Secondary | ICD-10-CM | POA: Diagnosis not present

## 2023-04-13 DIAGNOSIS — M5441 Lumbago with sciatica, right side: Secondary | ICD-10-CM | POA: Diagnosis not present

## 2023-04-13 DIAGNOSIS — M5136 Other intervertebral disc degeneration, lumbar region: Secondary | ICD-10-CM | POA: Diagnosis not present

## 2023-04-18 DIAGNOSIS — R202 Paresthesia of skin: Secondary | ICD-10-CM | POA: Diagnosis not present

## 2023-04-18 DIAGNOSIS — M7918 Myalgia, other site: Secondary | ICD-10-CM | POA: Diagnosis not present

## 2023-04-18 DIAGNOSIS — M5137 Other intervertebral disc degeneration, lumbosacral region: Secondary | ICD-10-CM | POA: Diagnosis not present

## 2023-04-18 DIAGNOSIS — M5441 Lumbago with sciatica, right side: Secondary | ICD-10-CM | POA: Diagnosis not present

## 2023-04-18 DIAGNOSIS — M5136 Other intervertebral disc degeneration, lumbar region: Secondary | ICD-10-CM | POA: Diagnosis not present

## 2023-04-18 DIAGNOSIS — M9904 Segmental and somatic dysfunction of sacral region: Secondary | ICD-10-CM | POA: Diagnosis not present

## 2023-04-18 DIAGNOSIS — M9903 Segmental and somatic dysfunction of lumbar region: Secondary | ICD-10-CM | POA: Diagnosis not present

## 2023-04-20 DIAGNOSIS — M5136 Other intervertebral disc degeneration, lumbar region: Secondary | ICD-10-CM | POA: Diagnosis not present

## 2023-04-20 DIAGNOSIS — M5441 Lumbago with sciatica, right side: Secondary | ICD-10-CM | POA: Diagnosis not present

## 2023-04-20 DIAGNOSIS — M9903 Segmental and somatic dysfunction of lumbar region: Secondary | ICD-10-CM | POA: Diagnosis not present

## 2023-04-20 DIAGNOSIS — M9904 Segmental and somatic dysfunction of sacral region: Secondary | ICD-10-CM | POA: Diagnosis not present

## 2023-04-20 DIAGNOSIS — M7918 Myalgia, other site: Secondary | ICD-10-CM | POA: Diagnosis not present

## 2023-04-20 DIAGNOSIS — M5137 Other intervertebral disc degeneration, lumbosacral region: Secondary | ICD-10-CM | POA: Diagnosis not present

## 2023-04-20 DIAGNOSIS — R202 Paresthesia of skin: Secondary | ICD-10-CM | POA: Diagnosis not present

## 2023-04-25 DIAGNOSIS — M9904 Segmental and somatic dysfunction of sacral region: Secondary | ICD-10-CM | POA: Diagnosis not present

## 2023-04-25 DIAGNOSIS — M5137 Other intervertebral disc degeneration, lumbosacral region: Secondary | ICD-10-CM | POA: Diagnosis not present

## 2023-04-25 DIAGNOSIS — M5136 Other intervertebral disc degeneration, lumbar region: Secondary | ICD-10-CM | POA: Diagnosis not present

## 2023-04-25 DIAGNOSIS — M9903 Segmental and somatic dysfunction of lumbar region: Secondary | ICD-10-CM | POA: Diagnosis not present

## 2023-04-25 DIAGNOSIS — M5441 Lumbago with sciatica, right side: Secondary | ICD-10-CM | POA: Diagnosis not present

## 2023-04-25 DIAGNOSIS — R202 Paresthesia of skin: Secondary | ICD-10-CM | POA: Diagnosis not present

## 2023-04-27 DIAGNOSIS — M5441 Lumbago with sciatica, right side: Secondary | ICD-10-CM | POA: Diagnosis not present

## 2023-04-27 DIAGNOSIS — R202 Paresthesia of skin: Secondary | ICD-10-CM | POA: Diagnosis not present

## 2023-04-27 DIAGNOSIS — M9904 Segmental and somatic dysfunction of sacral region: Secondary | ICD-10-CM | POA: Diagnosis not present

## 2023-04-27 DIAGNOSIS — M9903 Segmental and somatic dysfunction of lumbar region: Secondary | ICD-10-CM | POA: Diagnosis not present

## 2023-04-27 DIAGNOSIS — M5136 Other intervertebral disc degeneration, lumbar region: Secondary | ICD-10-CM | POA: Diagnosis not present

## 2023-04-27 DIAGNOSIS — M5137 Other intervertebral disc degeneration, lumbosacral region: Secondary | ICD-10-CM | POA: Diagnosis not present

## 2023-04-27 DIAGNOSIS — M7918 Myalgia, other site: Secondary | ICD-10-CM | POA: Diagnosis not present

## 2023-05-02 DIAGNOSIS — M9903 Segmental and somatic dysfunction of lumbar region: Secondary | ICD-10-CM | POA: Diagnosis not present

## 2023-05-02 DIAGNOSIS — M5137 Other intervertebral disc degeneration, lumbosacral region with discogenic back pain only: Secondary | ICD-10-CM | POA: Diagnosis not present

## 2023-05-02 DIAGNOSIS — R202 Paresthesia of skin: Secondary | ICD-10-CM | POA: Diagnosis not present

## 2023-05-02 DIAGNOSIS — M5136 Other intervertebral disc degeneration, lumbar region with discogenic back pain only: Secondary | ICD-10-CM | POA: Diagnosis not present

## 2023-05-02 DIAGNOSIS — M7918 Myalgia, other site: Secondary | ICD-10-CM | POA: Diagnosis not present

## 2023-05-02 DIAGNOSIS — M5441 Lumbago with sciatica, right side: Secondary | ICD-10-CM | POA: Diagnosis not present

## 2023-05-02 DIAGNOSIS — M9904 Segmental and somatic dysfunction of sacral region: Secondary | ICD-10-CM | POA: Diagnosis not present

## 2023-05-04 DIAGNOSIS — M9903 Segmental and somatic dysfunction of lumbar region: Secondary | ICD-10-CM | POA: Diagnosis not present

## 2023-05-04 DIAGNOSIS — M9904 Segmental and somatic dysfunction of sacral region: Secondary | ICD-10-CM | POA: Diagnosis not present

## 2023-05-04 DIAGNOSIS — M5137 Other intervertebral disc degeneration, lumbosacral region with discogenic back pain only: Secondary | ICD-10-CM | POA: Diagnosis not present

## 2023-05-04 DIAGNOSIS — M7918 Myalgia, other site: Secondary | ICD-10-CM | POA: Diagnosis not present

## 2023-05-04 DIAGNOSIS — R202 Paresthesia of skin: Secondary | ICD-10-CM | POA: Diagnosis not present

## 2023-05-04 DIAGNOSIS — M5136 Other intervertebral disc degeneration, lumbar region with discogenic back pain only: Secondary | ICD-10-CM | POA: Diagnosis not present

## 2023-05-04 DIAGNOSIS — M5441 Lumbago with sciatica, right side: Secondary | ICD-10-CM | POA: Diagnosis not present

## 2023-05-09 DIAGNOSIS — M9903 Segmental and somatic dysfunction of lumbar region: Secondary | ICD-10-CM | POA: Diagnosis not present

## 2023-05-09 DIAGNOSIS — M5441 Lumbago with sciatica, right side: Secondary | ICD-10-CM | POA: Diagnosis not present

## 2023-05-09 DIAGNOSIS — M9904 Segmental and somatic dysfunction of sacral region: Secondary | ICD-10-CM | POA: Diagnosis not present

## 2023-05-09 DIAGNOSIS — M5136 Other intervertebral disc degeneration, lumbar region with discogenic back pain only: Secondary | ICD-10-CM | POA: Diagnosis not present

## 2023-05-09 DIAGNOSIS — R202 Paresthesia of skin: Secondary | ICD-10-CM | POA: Diagnosis not present

## 2023-05-10 ENCOUNTER — Other Ambulatory Visit: Payer: Medicare HMO

## 2023-05-10 DIAGNOSIS — I951 Orthostatic hypotension: Secondary | ICD-10-CM

## 2023-05-10 DIAGNOSIS — I10 Essential (primary) hypertension: Secondary | ICD-10-CM

## 2023-05-10 DIAGNOSIS — E782 Mixed hyperlipidemia: Secondary | ICD-10-CM | POA: Diagnosis not present

## 2023-05-10 DIAGNOSIS — R7301 Impaired fasting glucose: Secondary | ICD-10-CM

## 2023-05-11 LAB — LIPID PANEL
Chol/HDL Ratio: 3.1 ratio (ref 0.0–5.0)
Cholesterol, Total: 148 mg/dL (ref 100–199)
HDL: 48 mg/dL (ref >39)
LDL Chol Calc (NIH): 74 mg/dL (ref 0–99)
Triglycerides: 148 mg/dL (ref 0–149)
VLDL Cholesterol Cal: 26 mg/dL (ref 5–40)

## 2023-05-11 LAB — CMP14+EGFR
ALT: 21 [IU]/L (ref 0–44)
AST: 23 [IU]/L (ref 0–40)
Albumin: 4.2 g/dL (ref 3.7–4.7)
Alkaline Phosphatase: 69 [IU]/L (ref 44–121)
BUN/Creatinine Ratio: 17 (ref 10–24)
BUN: 14 mg/dL (ref 8–27)
Bilirubin Total: 0.5 mg/dL (ref 0.0–1.2)
CO2: 25 mmol/L (ref 20–29)
Calcium: 9.3 mg/dL (ref 8.6–10.2)
Chloride: 102 mmol/L (ref 96–106)
Creatinine, Ser: 0.84 mg/dL (ref 0.76–1.27)
Globulin, Total: 2.1 g/dL (ref 1.5–4.5)
Glucose: 93 mg/dL (ref 70–99)
Potassium: 4.4 mmol/L (ref 3.5–5.2)
Sodium: 140 mmol/L (ref 134–144)
Total Protein: 6.3 g/dL (ref 6.0–8.5)
eGFR: 87 mL/min/{1.73_m2} (ref >59)

## 2023-05-11 LAB — CBC WITH DIFFERENTIAL/PLATELET
Basophils Absolute: 0.1 10*3/uL (ref 0.0–0.2)
Basos: 1 %
EOS (ABSOLUTE): 0.2 10*3/uL (ref 0.0–0.4)
Eos: 4 %
Hematocrit: 46.6 % (ref 37.5–51.0)
Hemoglobin: 15.7 g/dL (ref 13.0–17.7)
Immature Grans (Abs): 0 10*3/uL (ref 0.0–0.1)
Immature Granulocytes: 0 %
Lymphocytes Absolute: 1.8 10*3/uL (ref 0.7–3.1)
Lymphs: 32 %
MCH: 33.8 pg — ABNORMAL HIGH (ref 26.6–33.0)
MCHC: 33.7 g/dL (ref 31.5–35.7)
MCV: 100 fL — ABNORMAL HIGH (ref 79–97)
Monocytes Absolute: 0.5 10*3/uL (ref 0.1–0.9)
Monocytes: 9 %
Neutrophils Absolute: 2.9 10*3/uL (ref 1.4–7.0)
Neutrophils: 54 %
Platelets: 178 10*3/uL (ref 150–450)
RBC: 4.65 x10E6/uL (ref 4.14–5.80)
RDW: 13 % (ref 11.6–15.4)
WBC: 5.6 10*3/uL (ref 3.4–10.8)

## 2023-05-11 LAB — HEMOGLOBIN A1C
Est. average glucose Bld gHb Est-mCnc: 120 mg/dL
Hgb A1c MFr Bld: 5.8 % — ABNORMAL HIGH (ref 4.8–5.6)

## 2023-05-16 ENCOUNTER — Other Ambulatory Visit: Payer: Self-pay | Admitting: Internal Medicine

## 2023-05-16 DIAGNOSIS — E782 Mixed hyperlipidemia: Secondary | ICD-10-CM

## 2023-05-17 ENCOUNTER — Ambulatory Visit (INDEPENDENT_AMBULATORY_CARE_PROVIDER_SITE_OTHER): Payer: Medicare HMO | Admitting: Internal Medicine

## 2023-05-17 VITALS — BP 110/80 | HR 77 | Ht 69.0 in | Wt 171.4 lb

## 2023-05-17 DIAGNOSIS — I10 Essential (primary) hypertension: Secondary | ICD-10-CM

## 2023-05-17 DIAGNOSIS — Z23 Encounter for immunization: Secondary | ICD-10-CM

## 2023-05-17 DIAGNOSIS — E782 Mixed hyperlipidemia: Secondary | ICD-10-CM

## 2023-05-17 MED ORDER — AMLODIPINE BESY-BENAZEPRIL HCL 5-20 MG PO CAPS: 1.0000 | ORAL_CAPSULE | Freq: Every morning | ORAL | 1 refills | Status: DC

## 2023-05-17 NOTE — Addendum Note (Signed)
Addended by: Gwynne Edinger on: 05/17/2023 10:52 AM   Modules accepted: Orders

## 2023-05-17 NOTE — Progress Notes (Signed)
Established Patient Office Visit  Subjective:  Patient ID: Charles Madden, male    DOB: December 30, 1940  Age: 82 y.o. MRN: [id number]  Chief Complaint  Patient presents with   Follow-up    3 mo f/u    Larey Seat two months ago when his right knee gave way and subsequently c/o lower back pain for which he saw a chiropractor with improvement. Still c/o weak legs however. LDL and TC well controlled on lab review. Triglycerides also satisfactory and A1c in the prediabetic range.  No other concerns at this time.   Past Medical History:  Diagnosis Date   Hyperlipidemia    Hypertension     Past Surgical History:  Procedure Laterality Date   APPENDECTOMY      Social History   Socioeconomic History   Marital status: Widowed    Spouse name: Not on file   Number of children: Not on file   Years of education: Not on file   Highest education level: Not on file  Occupational History   Not on file  Tobacco Use   Smoking status: Every Day    Current packs/day: 0.25    Types: Cigarettes   Smokeless tobacco: Never  Substance and Sexual Activity   Alcohol use: Not Currently   Drug use: Never   Sexual activity: Not on file  Other Topics Concern   Not on file  Social History Narrative   Not on file   Social Determinants of Health   Financial Resource Strain: Not on file  Food Insecurity: Not on file  Transportation Needs: Not on file  Physical Activity: Not on file  Stress: Not on file  Social Connections: Not on file  Intimate Partner Violence: Not on file    No family history on file.  No Known Allergies  Review of Systems  Constitutional: Negative.  Negative for weight loss.  HENT: Negative.  Negative for congestion, ear discharge, ear pain and tinnitus.   Eyes: Negative.   Respiratory: Negative.    Cardiovascular: Negative.   Gastrointestinal: Negative.   Genitourinary: Negative.   Skin: Negative.   Neurological:  Positive for dizziness.  Endo/Heme/Allergies:  Negative.        Objective:   BP 110/80   Pulse 77   Ht 5\' 9"  (1.753 m)   Wt 171 lb 6.4 oz (77.7 kg)   SpO2 98%   BMI 25.31 kg/m   Vitals:   05/17/23 1000  BP: 110/80  Pulse: 77  Height: 5\' 9"  (1.753 m)  Weight: 171 lb 6.4 oz (77.7 kg)  SpO2: 98%  BMI (Calculated): 25.3    Physical Exam Vitals reviewed.  Constitutional:      Appearance: Normal appearance.  HENT:     Head: Normocephalic.     Left Ear: There is no impacted cerumen.     Nose: Nose normal.     Mouth/Throat:     Mouth: Mucous membranes are moist.     Pharynx: No posterior oropharyngeal erythema.  Eyes:     Extraocular Movements: Extraocular movements intact.     Pupils: Pupils are equal, round, and reactive to light.  Cardiovascular:     Rate and Rhythm: Regular rhythm.     Chest Wall: PMI is not displaced.     Pulses: Normal pulses.     Heart sounds: Normal heart sounds. No murmur heard. Pulmonary:     Effort: Pulmonary effort is normal.     Breath sounds: Normal air entry. No rhonchi or rales.  Abdominal:     General: Abdomen is flat. Bowel sounds are normal. There is no distension.     Palpations: Abdomen is soft. There is no hepatomegaly, splenomegaly or mass.     Tenderness: There is no abdominal tenderness.  Musculoskeletal:        General: Normal range of motion.     Cervical back: Normal range of motion and neck supple.     Right lower leg: 1+ Pitting Edema present.     Left lower leg: 1+ Edema present.  Skin:    General: Skin is warm and dry.     Comments: Hemosiderin staining of both legs  Neurological:     General: No focal deficit present.     Mental Status: He is alert and oriented to person, place, and time.     Cranial Nerves: No cranial nerve deficit.     Motor: No weakness.  Psychiatric:        Mood and Affect: Mood normal.        Behavior: Behavior normal.      No results found for any visits on 05/17/23.  Recent Results (from the past 2160 hour(Brunetta Newingham))  CBC with  Differential/Platelet     Status: Abnormal   Collection Time: 05/10/23  9:41 AM  Result Value Ref Range   WBC 5.6 3.4 - 10.8 x10E3/uL   RBC 4.65 4.14 - 5.80 x10E6/uL   Hemoglobin 15.7 13.0 - 17.7 g/dL   Hematocrit 54.0 98.1 - 51.0 %   MCV 100 (H) 79 - 97 fL   MCH 33.8 (H) 26.6 - 33.0 pg   MCHC 33.7 31.5 - 35.7 g/dL   RDW 19.1 47.8 - 29.5 %   Platelets 178 150 - 450 x10E3/uL   Neutrophils 54 Not Estab. %   Lymphs 32 Not Estab. %   Monocytes 9 Not Estab. %   Eos 4 Not Estab. %   Basos 1 Not Estab. %   Neutrophils Absolute 2.9 1.4 - 7.0 x10E3/uL   Lymphocytes Absolute 1.8 0.7 - 3.1 x10E3/uL   Monocytes Absolute 0.5 0.1 - 0.9 x10E3/uL   EOS (ABSOLUTE) 0.2 0.0 - 0.4 x10E3/uL   Basophils Absolute 0.1 0.0 - 0.2 x10E3/uL   Immature Granulocytes 0 Not Estab. %   Immature Grans (Abs) 0.0 0.0 - 0.1 x10E3/uL  CMP14+EGFR     Status: None   Collection Time: 05/10/23  9:41 AM  Result Value Ref Range   Glucose 93 70 - 99 mg/dL   BUN 14 8 - 27 mg/dL   Creatinine, Ser 6.21 0.76 - 1.27 mg/dL   eGFR 87 >30 QM/VHQ/4.69   BUN/Creatinine Ratio 17 10 - 24   Sodium 140 134 - 144 mmol/L   Potassium 4.4 3.5 - 5.2 mmol/L   Chloride 102 96 - 106 mmol/L   CO2 25 20 - 29 mmol/L   Calcium 9.3 8.6 - 10.2 mg/dL   Total Protein 6.3 6.0 - 8.5 g/dL   Albumin 4.2 3.7 - 4.7 g/dL   Globulin, Total 2.1 1.5 - 4.5 g/dL   Bilirubin Total 0.5 0.0 - 1.2 mg/dL   Alkaline Phosphatase 69 44 - 121 IU/L   AST 23 0 - 40 IU/L   ALT 21 0 - 44 IU/L  Hemoglobin A1c     Status: Abnormal   Collection Time: 05/10/23  9:41 AM  Result Value Ref Range   Hgb A1c MFr Bld 5.8 (H) 4.8 - 5.6 %    Comment:  Prediabetes: 5.7 - 6.4          Diabetes: >6.4          Glycemic control for adults with diabetes: <7.0    Est. average glucose Bld gHb Est-mCnc 120 mg/dL  Lipid panel     Status: None   Collection Time: 05/10/23  9:41 AM  Result Value Ref Range   Cholesterol, Total 148 100 - 199 mg/dL   Triglycerides 295 0 - 149  mg/dL   HDL 48 >18 mg/dL   VLDL Cholesterol Cal 26 5 - 40 mg/dL   LDL Chol Calc (NIH) 74 0 - 99 mg/dL   Chol/HDL Ratio 3.1 0.0 - 5.0 ratio    Comment:                                   T. Chol/HDL Ratio                                             Men  Women                               1/2 Avg.Risk  3.4    3.3                                   Avg.Risk  5.0    4.4                                2X Avg.Risk  9.6    7.1                                3X Avg.Risk 23.4   11.0       Assessment & Plan:  As per problem list. Problem List Items Addressed This Visit       Cardiovascular and Mediastinum   Primary hypertension   Relevant Medications   amLODipine-benazepril (LOTREL) 5-20 MG capsule     Other   Mixed hyperlipidemia - Primary   Relevant Medications   amLODipine-benazepril (LOTREL) 5-20 MG capsule   Other Relevant Orders   Lipid panel   Comprehensive metabolic panel   Other Visit Diagnoses     Needs flu shot       Relevant Orders   Flu Vaccine Trivalent High Dose (Fluad)       Return in about 3 months (around 08/17/2023) for fu with labs prior.   Total time spent: 20 minutes  Luna Fuse, MD  05/17/2023   This document may have been prepared by [facility] Voice Recognition software and as such may include unintentional dictation errors.

## 2023-06-01 DIAGNOSIS — D3131 Benign neoplasm of right choroid: Secondary | ICD-10-CM | POA: Diagnosis not present

## 2023-06-01 DIAGNOSIS — D3132 Benign neoplasm of left choroid: Secondary | ICD-10-CM | POA: Diagnosis not present

## 2023-06-01 DIAGNOSIS — H2513 Age-related nuclear cataract, bilateral: Secondary | ICD-10-CM | POA: Diagnosis not present

## 2023-06-01 DIAGNOSIS — H40053 Ocular hypertension, bilateral: Secondary | ICD-10-CM | POA: Diagnosis not present

## 2023-06-01 DIAGNOSIS — Z0100 Encounter for examination of eyes and vision without abnormal findings: Secondary | ICD-10-CM | POA: Diagnosis not present

## 2023-06-06 DIAGNOSIS — M5137 Other intervertebral disc degeneration, lumbosacral region with discogenic back pain only: Secondary | ICD-10-CM | POA: Diagnosis not present

## 2023-06-06 DIAGNOSIS — R202 Paresthesia of skin: Secondary | ICD-10-CM | POA: Diagnosis not present

## 2023-06-06 DIAGNOSIS — M9904 Segmental and somatic dysfunction of sacral region: Secondary | ICD-10-CM | POA: Diagnosis not present

## 2023-06-06 DIAGNOSIS — M9903 Segmental and somatic dysfunction of lumbar region: Secondary | ICD-10-CM | POA: Diagnosis not present

## 2023-06-06 DIAGNOSIS — M7918 Myalgia, other site: Secondary | ICD-10-CM | POA: Diagnosis not present

## 2023-06-06 DIAGNOSIS — M5441 Lumbago with sciatica, right side: Secondary | ICD-10-CM | POA: Diagnosis not present

## 2023-06-06 DIAGNOSIS — M5136 Other intervertebral disc degeneration, lumbar region with discogenic back pain only: Secondary | ICD-10-CM | POA: Diagnosis not present

## 2023-06-08 ENCOUNTER — Encounter: Payer: Self-pay | Admitting: Ophthalmology

## 2023-06-08 ENCOUNTER — Other Ambulatory Visit: Payer: Self-pay | Admitting: Internal Medicine

## 2023-06-08 DIAGNOSIS — I10 Essential (primary) hypertension: Secondary | ICD-10-CM

## 2023-06-12 DIAGNOSIS — H2511 Age-related nuclear cataract, right eye: Secondary | ICD-10-CM | POA: Diagnosis not present

## 2023-06-20 NOTE — Discharge Instructions (Signed)

## 2023-06-22 ENCOUNTER — Ambulatory Visit: Payer: Medicare HMO | Admitting: Anesthesiology

## 2023-06-22 ENCOUNTER — Encounter
Admission: RE | Disposition: A | Discharge status: Home / Self Care | Payer: Self-pay | Source: Home / Self Care | Attending: Ophthalmology

## 2023-06-22 ENCOUNTER — Ambulatory Visit
Admission: RE | Admit: 2023-06-22 | Discharge: 2023-06-22 | Disposition: A | Discharge status: Home / Self Care | Payer: Medicare HMO | Attending: Ophthalmology | Admitting: Ophthalmology

## 2023-06-22 ENCOUNTER — Other Ambulatory Visit: Payer: Self-pay

## 2023-06-22 DIAGNOSIS — F17200 Nicotine dependence, unspecified, uncomplicated: Secondary | ICD-10-CM | POA: Diagnosis not present

## 2023-06-22 DIAGNOSIS — F17210 Nicotine dependence, cigarettes, uncomplicated: Secondary | ICD-10-CM | POA: Insufficient documentation

## 2023-06-22 DIAGNOSIS — H2511 Age-related nuclear cataract, right eye: Secondary | ICD-10-CM | POA: Insufficient documentation

## 2023-06-22 DIAGNOSIS — I10 Essential (primary) hypertension: Secondary | ICD-10-CM | POA: Diagnosis not present

## 2023-06-22 DIAGNOSIS — E1136 Type 2 diabetes mellitus with diabetic cataract: Secondary | ICD-10-CM | POA: Diagnosis not present

## 2023-06-22 HISTORY — DX: Psoriasis, unspecified: L40.9

## 2023-06-22 HISTORY — PX: CATARACT EXTRACTION W/PHACO: SHX586

## 2023-06-22 SURGERY — PHACOEMULSIFICATION, CATARACT, WITH IOL INSERTION
Anesthesia: Monitor Anesthesia Care | Site: Eye | Laterality: Right

## 2023-06-22 MED ORDER — ACETAMINOPHEN 10 MG/ML IV SOLN: 1000.0000 mg | Freq: Once | INTRAVENOUS | Status: DC | PRN

## 2023-06-22 MED ORDER — CEFUROXIME OPHTHALMIC INJECTION 1 MG/0.1 ML
INJECTION | OPHTHALMIC | Status: DC | PRN
  Administered 2023-06-22: .1 mL via INTRACAMERAL

## 2023-06-22 MED ORDER — FENTANYL CITRATE (PF) 100 MCG/2ML IJ SOLN
INTRAMUSCULAR | Status: AC
  Filled 2023-06-22: qty 2

## 2023-06-22 MED ORDER — DROPERIDOL 2.5 MG/ML IJ SOLN: 0.6250 mg | Freq: Once | INTRAMUSCULAR | Status: DC | PRN

## 2023-06-22 MED ORDER — SIGHTPATH DOSE#1 NA HYALUR & NA CHOND-NA HYALUR IO KIT
PACK | INTRAOCULAR | Status: DC | PRN
  Administered 2023-06-22: 1 via OPHTHALMIC

## 2023-06-22 MED ORDER — FENTANYL CITRATE (PF) 100 MCG/2ML IJ SOLN
INTRAMUSCULAR | Status: DC | PRN
  Administered 2023-06-22: 25 ug via INTRAVENOUS

## 2023-06-22 MED ORDER — MIDAZOLAM HCL 2 MG/2ML IJ SOLN
INTRAMUSCULAR | Status: DC | PRN
  Administered 2023-06-22: 1 mg via INTRAVENOUS

## 2023-06-22 MED ORDER — TETRACAINE HCL 0.5 % OP SOLN
OPHTHALMIC | Status: AC
  Filled 2023-06-22: qty 4

## 2023-06-22 MED ORDER — OXYCODONE HCL 5 MG PO TABS: 5.0000 mg | ORAL_TABLET | Freq: Once | ORAL | Status: DC | PRN

## 2023-06-22 MED ORDER — BRIMONIDINE TARTRATE-TIMOLOL 0.2-0.5 % OP SOLN
OPHTHALMIC | Status: DC | PRN
  Administered 2023-06-22: 1 [drp] via OPHTHALMIC

## 2023-06-22 MED ORDER — SIGHTPATH DOSE#1 BSS IO SOLN
INTRAOCULAR | Status: DC | PRN
  Administered 2023-06-22: 52 mL via OPHTHALMIC

## 2023-06-22 MED ORDER — TETRACAINE HCL 0.5 % OP SOLN
1.0000 [drp] | OPHTHALMIC | Status: DC | PRN
  Administered 2023-06-22 (×2): 1 [drp] via OPHTHALMIC

## 2023-06-22 MED ORDER — FENTANYL CITRATE PF 50 MCG/ML IJ SOSY: 25.0000 ug | PREFILLED_SYRINGE | INTRAMUSCULAR | Status: DC | PRN

## 2023-06-22 MED ORDER — SODIUM CHLORIDE 0.9% FLUSH: 10.0000 mL | Freq: Two times a day (BID) | INTRAVENOUS | Status: DC

## 2023-06-22 MED ORDER — OXYCODONE HCL 5 MG/5ML PO SOLN: 5.0000 mg | Freq: Once | ORAL | Status: DC | PRN

## 2023-06-22 MED ORDER — MIDAZOLAM HCL 2 MG/2ML IJ SOLN
INTRAMUSCULAR | Status: AC
  Filled 2023-06-22: qty 2

## 2023-06-22 MED ORDER — SIGHTPATH DOSE#1 BSS IO SOLN
INTRAOCULAR | Status: DC | PRN
  Administered 2023-06-22: 1 mL via INTRAMUSCULAR

## 2023-06-22 MED ORDER — SIGHTPATH DOSE#1 BSS IO SOLN
INTRAOCULAR | Status: DC | PRN
  Administered 2023-06-22: 15 mL

## 2023-06-22 MED ORDER — ARMC OPHTHALMIC DILATING DROPS
1.0000 | OPHTHALMIC | Status: DC | PRN
  Administered 2023-06-22 (×3): 1 via OPHTHALMIC

## 2023-06-22 SURGICAL SUPPLY — 9 items
CATARACT SUITE SIGHTPATH (MISCELLANEOUS) ×1
FEE CATARACT SUITE SIGHTPATH (MISCELLANEOUS) ×1 IMPLANT
GLOVE SRG 8 PF TXTR STRL LF DI (GLOVE) ×1 IMPLANT
GLOVE SURG ENC TEXT LTX SZ7.5 (GLOVE) ×1 IMPLANT
LENS IOL TECNIS EYHANCE 22.0 (Intraocular Lens) IMPLANT
NDL FILTER BLUNT 18X1 1/2 (NEEDLE) ×1 IMPLANT
NEEDLE FILTER BLUNT 18X1 1/2 (NEEDLE) ×1
SYR 3ML LL SCALE MARK (SYRINGE) ×1 IMPLANT
TIP IRRIGATON/ASPIRATION (MISCELLANEOUS) IMPLANT

## 2023-06-22 NOTE — Anesthesia Postprocedure Evaluation (Signed)
Anesthesia Post Note  Patient: Charles Madden  Procedure(s) Performed: CATARACT EXTRACTION PHACO AND INTRAOCULAR LENS PLACEMENT (IOC) RIGHT  9.67  01:01.0 (Right: Eye)  Patient location during evaluation: PACU Anesthesia Type: MAC Level of consciousness: awake and alert Pain management: pain level controlled Vital Signs Assessment: post-procedure vital signs reviewed and stable Respiratory status: spontaneous breathing, nonlabored ventilation, respiratory function stable and patient connected to nasal cannula oxygen Cardiovascular status: stable and blood pressure returned to baseline Postop Assessment: no apparent nausea or vomiting Anesthetic complications: no   No notable events documented.   Last Vitals:  Vitals:   06/22/23 0906 06/22/23 0911  BP: 121/66 129/79  Pulse: 79 70  Resp: 14 15  Temp: 36.5 C 36.5 C  SpO2: 98% 98%    Last Pain:  Vitals:   06/22/23 0911  TempSrc:   PainSc: 0-No pain                 Yevette Edwards

## 2023-06-22 NOTE — H&P (Signed)
  [facility]   Primary Care Physician:  Sherron Monday, MD Ophthalmologist: Dr. Lockie Mola  Pre-Procedure History & Physical: HPI:  Charles Madden is a 82 y.o. male here for ophthalmic surgery.   Past Medical History:  Diagnosis Date   Hyperlipidemia    Hypertension    Psoriasis     Past Surgical History:  Procedure Laterality Date   APPENDECTOMY      Prior to Admission medications   Medication Sig Start Date End Date Taking? Authorizing Provider  albuterol (VENTOLIN HFA) 108 (90 Base) MCG/ACT inhaler Inhale 2 puffs into the lungs every 4 (four) hours as needed for wheezing or shortness of breath. 04/03/23  Yes Tejan-Sie, Marcelino Freestone, MD  amLODipine-benazepril (LOTREL) 5-20 MG capsule TAKE 1 CAPSULE EVERY MORNING 06/09/23  Yes Tejan-Sie, Marcelino Freestone, MD  atorvastatin (LIPITOR) 10 MG tablet TAKE 1 TABLET EVERY EVENING 05/16/23  Yes Tejan-Sie, Marcelino Freestone, MD  folic acid (FOLVITE) 1 MG tablet Take 1 mg by mouth daily.   Yes [provider]  methotrexate (RHEUMATREX) 2.5 MG tablet Take 5 mg by mouth once a week. Caution:Chemotherapy. Protect from light.   Yes [provider]  pantoprazole (PROTONIX) 20 MG tablet TAKE 1 TABLET EVERY MORNING 08/22/22  Yes Sherron Monday, MD    Allergies as of 06/05/2023   (No Known Allergies)    History reviewed. No pertinent family history.  Social History   Socioeconomic History   Marital status: Widowed    Spouse name: Not on file   Number of children: Not on file   Years of education: Not on file   Highest education level: Not on file  Occupational History   Not on file  Tobacco Use   Smoking status: Every Day    Current packs/day: 0.25    Types: Cigarettes   Smokeless tobacco: Never  Substance and Sexual Activity   Alcohol use: Not Currently   Drug use: Never   Sexual activity: Not on file  Other Topics Concern   Not on file  Social History Narrative   Not on file   Social Determinants of  Health   Financial Resource Strain: Not on file  Food Insecurity: Not on file  Transportation Needs: Not on file  Physical Activity: Not on file  Stress: Not on file  Social Connections: Not on file  Intimate Partner Violence: Not on file    Review of Systems: See HPI, otherwise negative ROS  Physical Exam: BP 129/75   Pulse 75   Temp (!) 97.2 F (36.2 C) (Temporal)   Resp 12   Ht 5\' 9"  (1.753 m)   Wt 78 kg   SpO2 (!) 75%   BMI 25.40 kg/m  General:   Alert,  pleasant and cooperative in NAD Head:  Normocephalic and atraumatic. Lungs:  Clear to auscultation.    Heart:  Regular rate and rhythm.   Impression/Plan: Charles Madden is here for ophthalmic surgery.  Risks, benefits, limitations, and alternatives regarding ophthalmic surgery have been reviewed with the patient.  Questions have been answered.  All parties agreeable.   Lockie Mola, MD  06/22/2023, 8:04 AM

## 2023-06-22 NOTE — Anesthesia Preprocedure Evaluation (Signed)
Anesthesia Evaluation  Patient identified by MRN, date of birth, ID band Patient awake    Reviewed: Allergy & Precautions, H&P , NPO status , Patient's Chart, lab work & pertinent test results, reviewed documented beta blocker date and time   Airway Mallampati: II  TM Distance: >3 FB Neck ROM: full    Dental no notable dental hx. (+) Teeth Intact   Pulmonary neg pulmonary ROS, Current Smoker and Patient abstained from smoking.   Pulmonary exam normal breath sounds clear to auscultation       Cardiovascular Exercise Tolerance: Good hypertension, On Medications negative cardio ROS  Rhythm:regular Rate:Normal     Neuro/Psych negative neurological ROS  negative psych ROS   GI/Hepatic negative GI ROS, Neg liver ROS,,,  Endo/Other  negative endocrine ROSdiabetes, Well Controlled    Renal/GU      Musculoskeletal   Abdominal   Peds  Hematology negative hematology ROS (+)   Anesthesia Other Findings   Reproductive/Obstetrics negative OB ROS                             Anesthesia Physical Anesthesia Plan  ASA: 2  Anesthesia Plan: MAC   Post-op Pain Management:    Induction:   PONV Risk Score and Plan:   Airway Management Planned:   Additional Equipment:   Intra-op Plan:   Post-operative Plan:   Informed Consent: I have reviewed the patients History and Physical, chart, labs and discussed the procedure including the risks, benefits and alternatives for the proposed anesthesia with the patient or authorized representative who has indicated his/her understanding and acceptance.       Plan Discussed with: CRNA  Anesthesia Plan Comments:        Anesthesia Quick Evaluation

## 2023-06-22 NOTE — Transfer of Care (Signed)
Immediate Anesthesia Transfer of Care Note  Patient: Charles Madden  Procedure(s) Performed: CATARACT EXTRACTION PHACO AND INTRAOCULAR LENS PLACEMENT (IOC) RIGHT  9.67  01:01.0 (Right: Eye)  Patient Location: PACU  Anesthesia Type:MAC  Level of Consciousness: awake, alert , and oriented  Airway & Oxygen Therapy: Patient Spontanous Breathing  Post-op Assessment: Report given to RN and Post -op Vital signs reviewed and stable  Post vital signs: Reviewed and stable  Last Vitals:  Vitals Value Taken Time  BP    Temp    Pulse 76 06/22/23 0906  Resp    SpO2 98 % 06/22/23 0906  Vitals shown include unfiled device data.  Last Pain:  Vitals:   06/22/23 0802  TempSrc: Temporal  PainSc: 0-No pain         Complications: No notable events documented.

## 2023-06-22 NOTE — Op Note (Signed)
LOCATION:  [facility]   PREOPERATIVE DIAGNOSIS:    Nuclear sclerotic cataract right eye. H25.11   POSTOPERATIVE DIAGNOSIS:  Nuclear sclerotic cataract right eye.     PROCEDURE:  Phacoemusification with posterior chamber intraocular lens placement of the right eye   ULTRASOUND TIME: Procedure(s): CATARACT EXTRACTION PHACO AND INTRAOCULAR LENS PLACEMENT (IOC) RIGHT  9.67  01:01.0 (Right)  LENS:   Implant Name Type Inv. Item Serial No. Manufacturer Lot No. LRB No. Used Action  LENS IOL TECNIS EYHANCE 22.0 - [id number] Intraocular Lens LENS IOL TECNIS EYHANCE 22.0 [id number] [employer]  Right 1 Implanted         SURGEON:  Deirdre Evener, MD   ANESTHESIA:  Topical with tetracaine drops and 2% Xylocaine jelly, augmented with 1% preservative-free intracameral lidocaine.    COMPLICATIONS:  None.   DESCRIPTION OF PROCEDURE:  The patient was identified in the holding room and transported to the operating room and placed in the supine position under the operating microscope.  The right eye was identified as the operative eye and it was prepped and draped in the usual sterile ophthalmic fashion.   A 1 millimeter clear-corneal paracentesis was made at the 12:00 position.  0.5 ml of preservative-free 1% lidocaine was injected into the anterior chamber. The anterior chamber was filled with Viscoat viscoelastic.  A 2.4 millimeter keratome was used to make a near-clear corneal incision at the 9:00 position.  A curvilinear capsulorrhexis was made with a cystotome and capsulorrhexis forceps.  Balanced salt solution was used to hydrodissect and hydrodelineate the nucleus.   Phacoemulsification was then used in stop and chop fashion to remove the lens nucleus and epinucleus.  The remaining cortex was then removed using the irrigation and aspiration handpiece. Provisc was then placed into the capsular bag to distend it for lens placement.  A lens was then injected into the capsular bag.   The remaining viscoelastic was aspirated.   Wounds were hydrated with balanced salt solution.  The anterior chamber was inflated to a physiologic pressure with balanced salt solution.  No wound leaks were noted. Cefuroxime 0.1 ml of a 10mg /ml solution was injected into the anterior chamber for a dose of 1 mg of intracameral antibiotic at the completion of the case.   Timolol and Brimonidine drops were applied to the eye.  The patient was taken to the recovery room in stable condition without complications of anesthesia or surgery.   Pearson Reasons 06/22/2023, 9:05 AM

## 2023-06-23 ENCOUNTER — Encounter: Payer: Self-pay | Admitting: Ophthalmology

## 2023-06-23 DIAGNOSIS — H2512 Age-related nuclear cataract, left eye: Secondary | ICD-10-CM | POA: Diagnosis not present

## 2023-06-26 ENCOUNTER — Encounter: Payer: Self-pay | Admitting: Ophthalmology

## 2023-06-27 NOTE — Anesthesia Preprocedure Evaluation (Addendum)
Anesthesia Evaluation  Patient identified by MRN, date of birth, ID band Patient awake    Reviewed: Allergy & Precautions, H&P , NPO status , Patient's Chart, lab work & pertinent test results  Airway Mallampati: II  TM Distance: >3 FB Neck ROM: Full    Dental no notable dental hx.    Pulmonary Current Smoker and Patient abstained from smoking.   Pulmonary exam normal breath sounds clear to auscultation       Cardiovascular hypertension, Normal cardiovascular exam Rhythm:Regular Rate:Normal     Neuro/Psych negative neurological ROS  negative psych ROS   GI/Hepatic negative GI ROS, Neg liver ROS,,,  Endo/Other  negative endocrine ROS    Renal/GU negative Renal ROS  negative genitourinary   Musculoskeletal negative musculoskeletal ROS (+)    Abdominal   Peds negative pediatric ROS (+)  Hematology negative hematology ROS (+)   Anesthesia Other Findings HTN Psoriasis Hyperlipidemia  Previous cataract surgery 06-22-23  Dr. Pernell Dupre and Hoover Browns, CRNA, Versed 1 mg IV and fentanyl 25 mcg IV  Reproductive/Obstetrics negative OB ROS                              Anesthesia Physical Anesthesia Plan  ASA: 2  Anesthesia Plan: MAC   Post-op Pain Management:    Induction: Intravenous  PONV Risk Score and Plan:   Airway Management Planned: Natural Airway and Nasal Cannula  Additional Equipment:   Intra-op Plan:   Post-operative Plan:   Informed Consent: I have reviewed the patients History and Physical, chart, labs and discussed the procedure including the risks, benefits and alternatives for the proposed anesthesia with the patient or authorized representative who has indicated his/her understanding and acceptance.     Dental Advisory Given  Plan Discussed with: Anesthesiologist, CRNA and Surgeon  Anesthesia Plan Comments: (Patient consented for risks of anesthesia including  but not limited to:  - adverse reactions to medications - damage to eyes, teeth, lips or other oral mucosa - nerve damage due to positioning  - sore throat or hoarseness - Damage to heart, brain, nerves, lungs, other parts of body or loss of life  Patient voiced understanding and assent.)         Anesthesia Quick Evaluation

## 2023-07-03 NOTE — Discharge Instructions (Signed)

## 2023-07-05 ENCOUNTER — Other Ambulatory Visit: Payer: Self-pay

## 2023-07-05 ENCOUNTER — Encounter
Admission: RE | Disposition: A | Discharge status: Home / Self Care | Payer: Self-pay | Source: Home / Self Care | Attending: Ophthalmology

## 2023-07-05 ENCOUNTER — Encounter: Payer: Self-pay | Admitting: Ophthalmology

## 2023-07-05 ENCOUNTER — Ambulatory Visit
Admission: RE | Admit: 2023-07-05 | Discharge: 2023-07-05 | Disposition: A | Discharge status: Home / Self Care | Payer: Medicare HMO | Attending: Ophthalmology | Admitting: Ophthalmology

## 2023-07-05 ENCOUNTER — Ambulatory Visit: Payer: Medicare HMO | Admitting: Anesthesiology

## 2023-07-05 DIAGNOSIS — H2512 Age-related nuclear cataract, left eye: Secondary | ICD-10-CM | POA: Diagnosis not present

## 2023-07-05 DIAGNOSIS — F17210 Nicotine dependence, cigarettes, uncomplicated: Secondary | ICD-10-CM | POA: Diagnosis not present

## 2023-07-05 DIAGNOSIS — I10 Essential (primary) hypertension: Secondary | ICD-10-CM | POA: Insufficient documentation

## 2023-07-05 DIAGNOSIS — H269 Unspecified cataract: Secondary | ICD-10-CM | POA: Diagnosis not present

## 2023-07-05 HISTORY — PX: CATARACT EXTRACTION W/PHACO: SHX586

## 2023-07-05 SURGERY — PHACOEMULSIFICATION, CATARACT, WITH IOL INSERTION
Anesthesia: Monitor Anesthesia Care | Laterality: Left

## 2023-07-05 MED ORDER — SIGHTPATH DOSE#1 BSS IO SOLN
INTRAOCULAR | Status: DC | PRN
  Administered 2023-07-05: 15 mL via INTRAOCULAR

## 2023-07-05 MED ORDER — SIGHTPATH DOSE#1 NA HYALUR & NA CHOND-NA HYALUR IO KIT
PACK | INTRAOCULAR | Status: DC | PRN
  Administered 2023-07-05: 1 via OPHTHALMIC

## 2023-07-05 MED ORDER — TETRACAINE HCL 0.5 % OP SOLN
1.0000 [drp] | OPHTHALMIC | Status: DC | PRN
  Administered 2023-07-05 (×3): 1 [drp] via OPHTHALMIC

## 2023-07-05 MED ORDER — SIGHTPATH DOSE#1 BSS IO SOLN
INTRAOCULAR | Status: DC | PRN
  Administered 2023-07-05: 59 mL via OPHTHALMIC

## 2023-07-05 MED ORDER — MIDAZOLAM HCL 2 MG/2ML IJ SOLN
INTRAMUSCULAR | Status: AC
  Filled 2023-07-05: qty 2

## 2023-07-05 MED ORDER — FENTANYL CITRATE (PF) 100 MCG/2ML IJ SOLN
INTRAMUSCULAR | Status: DC | PRN
  Administered 2023-07-05: 25 ug via INTRAVENOUS

## 2023-07-05 MED ORDER — CEFUROXIME OPHTHALMIC INJECTION 1 MG/0.1 ML
INJECTION | OPHTHALMIC | Status: DC | PRN
  Administered 2023-07-05: 1 mg via INTRACAMERAL

## 2023-07-05 MED ORDER — MIDAZOLAM HCL 2 MG/2ML IJ SOLN
INTRAMUSCULAR | Status: DC | PRN
  Administered 2023-07-05: 1 mg via INTRAVENOUS

## 2023-07-05 MED ORDER — TETRACAINE HCL 0.5 % OP SOLN
OPHTHALMIC | Status: AC
  Filled 2023-07-05: qty 4

## 2023-07-05 MED ORDER — SIGHTPATH DOSE#1 BSS IO SOLN
INTRAOCULAR | Status: DC | PRN
  Administered 2023-07-05: 2 mL

## 2023-07-05 MED ORDER — ARMC OPHTHALMIC DILATING DROPS
1.0000 | OPHTHALMIC | Status: DC | PRN
  Administered 2023-07-05 (×3): 1 via OPHTHALMIC

## 2023-07-05 MED ORDER — ARMC OPHTHALMIC DILATING DROPS
OPHTHALMIC | Status: AC
  Filled 2023-07-05: qty 0.5

## 2023-07-05 MED ORDER — FENTANYL CITRATE (PF) 100 MCG/2ML IJ SOLN
INTRAMUSCULAR | Status: AC
  Filled 2023-07-05: qty 2

## 2023-07-05 MED ORDER — BRIMONIDINE TARTRATE-TIMOLOL 0.2-0.5 % OP SOLN
OPHTHALMIC | Status: DC | PRN
  Administered 2023-07-05: 1 [drp] via OPHTHALMIC

## 2023-07-05 SURGICAL SUPPLY — 8 items
CATARACT SUITE SIGHTPATH (MISCELLANEOUS) ×1
FEE CATARACT SUITE SIGHTPATH (MISCELLANEOUS) ×1 IMPLANT
GLOVE SRG 8 PF TXTR STRL LF DI (GLOVE) ×1 IMPLANT
GLOVE SURG ENC TEXT LTX SZ7.5 (GLOVE) ×1 IMPLANT
LENS IOL TECNIS EYHANCE 22.5 (Intraocular Lens) IMPLANT
NDL FILTER BLUNT 18X1 1/2 (NEEDLE) ×1 IMPLANT
NEEDLE FILTER BLUNT 18X1 1/2 (NEEDLE) ×1
SYR 3ML LL SCALE MARK (SYRINGE) ×1 IMPLANT

## 2023-07-05 NOTE — Transfer of Care (Signed)
Immediate Anesthesia Transfer of Care Note  Patient: Charles Madden  Procedure(s) Performed: CATARACT EXTRACTION PHACO AND INTRAOCULAR LENS PLACEMENT (IOC) LEFT 7.95 00:54.6 (Left)  Patient Location: PACU  Anesthesia Type: MAC  Level of Consciousness: awake, alert  and patient cooperative  Airway and Oxygen Therapy: Patient Spontanous Breathing and Patient connected to supplemental oxygen  Post-op Assessment: Post-op Vital signs reviewed, Patient's Cardiovascular Status Stable, Respiratory Function Stable, Patent Airway and No signs of Nausea or vomiting  Post-op Vital Signs: Reviewed and stable  Complications: No notable events documented.

## 2023-07-05 NOTE — Op Note (Signed)
OPERATIVE NOTE  YI GUILMETTE [id number] 07/05/2023   PREOPERATIVE DIAGNOSIS:  Nuclear sclerotic cataract left eye. H25.12   POSTOPERATIVE DIAGNOSIS:    Nuclear sclerotic cataract left eye.     PROCEDURE:  Phacoemusification with posterior chamber intraocular lens placement of the left eye  Ultrasound time: Procedure(s): CATARACT EXTRACTION PHACO AND INTRAOCULAR LENS PLACEMENT (IOC) LEFT 7.95 00:54.6 (Left)  LENS:   Implant Name Type Inv. Item Serial No. Manufacturer Lot No. LRB No. Used Action  LENS IOL TECNIS EYHANCE 22.5 - [id number] Intraocular Lens LENS IOL TECNIS EYHANCE 22.5 [id number] [employer]  Left 1 Implanted      SURGEON:  Deirdre Evener, MD   ANESTHESIA:  Topical with tetracaine drops and 2% Xylocaine jelly, augmented with 1% preservative-free intracameral lidocaine.    COMPLICATIONS:  None.   DESCRIPTION OF PROCEDURE:  The patient was identified in the holding room and transported to the operating room and placed in the supine position under the operating microscope.  The left eye was identified as the operative eye and it was prepped and draped in the usual sterile ophthalmic fashion.   A 1 millimeter clear-corneal paracentesis was made at the 1:30 position.  0.5 ml of preservative-free 1% lidocaine was injected into the anterior chamber.  The anterior chamber was filled with Viscoat viscoelastic.  A 2.4 millimeter keratome was used to make a near-clear corneal incision at the 10:30 position.  .  A curvilinear capsulorrhexis was made with a cystotome and capsulorrhexis forceps.  Balanced salt solution was used to hydrodissect and hydrodelineate the nucleus.   Phacoemulsification was then used in stop and chop fashion to remove the lens nucleus and epinucleus.  The remaining cortex was then removed using the irrigation and aspiration handpiece. Provisc was then placed into the capsular bag to distend it for lens placement.  A lens was then injected into the  capsular bag.  The remaining viscoelastic was aspirated.   Wounds were hydrated with balanced salt solution.  The anterior chamber was inflated to a physiologic pressure with balanced salt solution.  No wound leaks were noted. Cefuroxime 0.1 ml of a 10mg /ml solution was injected into the anterior chamber for a dose of 1 mg of intracameral antibiotic at the completion of the case.   Timolol and Brimonidine drops were applied to the eye.  The patient was taken to the recovery room in stable condition without complications of anesthesia or surgery.  Dejour Vos 07/05/2023, 12:50 PM

## 2023-07-05 NOTE — Anesthesia Postprocedure Evaluation (Signed)
Anesthesia Post Note  Patient: BRANDT SHELLMAN  Procedure(s) Performed: CATARACT EXTRACTION PHACO AND INTRAOCULAR LENS PLACEMENT (IOC) LEFT 7.95 00:54.6 (Left)  Patient location during evaluation: PACU Anesthesia Type: MAC Level of consciousness: awake and alert Pain management: pain level controlled Vital Signs Assessment: post-procedure vital signs reviewed and stable Respiratory status: spontaneous breathing, nonlabored ventilation, respiratory function stable and patient connected to nasal cannula oxygen Cardiovascular status: stable and blood pressure returned to baseline Postop Assessment: no apparent nausea or vomiting Anesthetic complications: no   No notable events documented.   Last Vitals:  Vitals:   07/05/23 1252 07/05/23 1257  BP: (!) 141/62 137/73  Pulse: 75 62  Resp: 17 14  Temp: (!) 36.1 C (!) 36.1 C  SpO2: 100% 99%    Last Pain:  Vitals:   07/05/23 1257  TempSrc:   PainSc: 0-No pain                 Lillianne Eick C Santez Woodcox

## 2023-07-05 NOTE — H&P (Signed)
  [facility]   Primary Care Physician:  Sherron Monday, MD Ophthalmologist: Dr. Lockie Mola  Pre-Procedure History & Physical: HPI:  Charles Madden is a 82 y.o. male here for ophthalmic surgery.   Past Medical History:  Diagnosis Date   Hyperlipidemia    Hypertension    Psoriasis     Past Surgical History:  Procedure Laterality Date   APPENDECTOMY     CATARACT EXTRACTION W/PHACO Right 06/22/2023   Procedure: CATARACT EXTRACTION PHACO AND INTRAOCULAR LENS PLACEMENT (IOC) RIGHT  9.67  01:01.0;  Surgeon: Lockie Mola, MD;  Location: [facility];  Service: Ophthalmology;  Laterality: Right;    Prior to Admission medications   Medication Sig Start Date End Date Taking? Authorizing Provider  amLODipine-benazepril (LOTREL) 5-20 MG capsule TAKE 1 CAPSULE EVERY MORNING 06/09/23  Yes Tejan-Sie, Marcelino Freestone, MD  atorvastatin (LIPITOR) 10 MG tablet TAKE 1 TABLET EVERY EVENING 05/16/23  Yes Tejan-Sie, Marcelino Freestone, MD  folic acid (FOLVITE) 1 MG tablet Take 1 mg by mouth daily.   Yes [provider]  pantoprazole (PROTONIX) 20 MG tablet TAKE 1 TABLET EVERY MORNING 08/22/22  Yes Tejan-Sie, Marcelino Freestone, MD  albuterol (VENTOLIN HFA) 108 (90 Base) MCG/ACT inhaler Inhale 2 puffs into the lungs every 4 (four) hours as needed for wheezing or shortness of breath. 04/03/23   Sherron Monday, MD  methotrexate (RHEUMATREX) 2.5 MG tablet Take 5 mg by mouth once a week. Caution:Chemotherapy. Protect from light.    [provider]    Allergies as of 06/05/2023   (No Known Allergies)    History reviewed. No pertinent family history.  Social History   Socioeconomic History   Marital status: Widowed    Spouse name: Not on file   Number of children: Not on file   Years of education: Not on file   Highest education level: Not on file  Occupational History   Not on file  Tobacco Use   Smoking status: Every Day    Current packs/day: 0.25    Types: Cigarettes    Smokeless tobacco: Never  Substance and Sexual Activity   Alcohol use: Not Currently   Drug use: Never   Sexual activity: Not on file  Other Topics Concern   Not on file  Social History Narrative   Not on file   Social Drivers of Health   Financial Resource Strain: Not on file  Food Insecurity: Not on file  Transportation Needs: Not on file  Physical Activity: Not on file  Stress: Not on file  Social Connections: Not on file  Intimate Partner Violence: Not on file    Review of Systems: See HPI, otherwise negative ROS  Physical Exam: BP 132/62   Pulse 72   Temp 97.9 F (36.6 C) (Temporal)   Resp 18   Ht 5' 9.02" (1.753 m)   Wt 77.9 kg   SpO2 99%   BMI 25.36 kg/m  General:   Alert,  pleasant and cooperative in NAD Head:  Normocephalic and atraumatic. Lungs:  Clear to auscultation.    Heart:  Regular rate and rhythm.   Impression/Plan: Charles Madden is here for ophthalmic surgery.  Risks, benefits, limitations, and alternatives regarding ophthalmic surgery have been reviewed with the patient.  Questions have been answered.  All parties agreeable.   Lockie Mola, MD  07/05/2023, 12:13 PM

## 2023-07-06 ENCOUNTER — Encounter: Payer: Self-pay | Admitting: Ophthalmology

## 2023-07-25 DIAGNOSIS — Z0100 Encounter for examination of eyes and vision without abnormal findings: Secondary | ICD-10-CM | POA: Diagnosis not present

## 2023-07-25 DIAGNOSIS — M9903 Segmental and somatic dysfunction of lumbar region: Secondary | ICD-10-CM | POA: Diagnosis not present

## 2023-07-25 DIAGNOSIS — M7918 Myalgia, other site: Secondary | ICD-10-CM | POA: Diagnosis not present

## 2023-07-25 DIAGNOSIS — M5441 Lumbago with sciatica, right side: Secondary | ICD-10-CM | POA: Diagnosis not present

## 2023-07-25 DIAGNOSIS — M5137 Other intervertebral disc degeneration, lumbosacral region with discogenic back pain only: Secondary | ICD-10-CM | POA: Diagnosis not present

## 2023-07-25 DIAGNOSIS — M9904 Segmental and somatic dysfunction of sacral region: Secondary | ICD-10-CM | POA: Diagnosis not present

## 2023-07-25 DIAGNOSIS — Z961 Presence of intraocular lens: Secondary | ICD-10-CM | POA: Diagnosis not present

## 2023-07-25 DIAGNOSIS — M5136 Other intervertebral disc degeneration, lumbar region with discogenic back pain only: Secondary | ICD-10-CM | POA: Diagnosis not present

## 2023-07-25 DIAGNOSIS — R202 Paresthesia of skin: Secondary | ICD-10-CM | POA: Diagnosis not present

## 2023-07-26 ENCOUNTER — Other Ambulatory Visit: Payer: Self-pay | Admitting: Internal Medicine

## 2023-08-09 DIAGNOSIS — J209 Acute bronchitis, unspecified: Secondary | ICD-10-CM | POA: Diagnosis not present

## 2023-08-12 ENCOUNTER — Other Ambulatory Visit: Payer: Self-pay | Admitting: Internal Medicine

## 2023-08-14 ENCOUNTER — Other Ambulatory Visit: Payer: Medicare HMO

## 2023-08-14 DIAGNOSIS — E782 Mixed hyperlipidemia: Secondary | ICD-10-CM

## 2023-08-15 LAB — COMPREHENSIVE METABOLIC PANEL
ALT: 16 [IU]/L (ref 0–44)
AST: 20 [IU]/L (ref 0–40)
Albumin: 4.1 g/dL (ref 3.7–4.7)
Alkaline Phosphatase: 85 [IU]/L (ref 44–121)
BUN/Creatinine Ratio: 11 (ref 10–24)
BUN: 10 mg/dL (ref 8–27)
Bilirubin Total: 0.4 mg/dL (ref 0.0–1.2)
CO2: 23 mmol/L (ref 20–29)
Calcium: 9.2 mg/dL (ref 8.6–10.2)
Chloride: 104 mmol/L (ref 96–106)
Creatinine, Ser: 0.94 mg/dL (ref 0.76–1.27)
Globulin, Total: 1.9 g/dL (ref 1.5–4.5)
Glucose: 97 mg/dL (ref 70–99)
Potassium: 4.6 mmol/L (ref 3.5–5.2)
Sodium: 140 mmol/L (ref 134–144)
Total Protein: 6 g/dL (ref 6.0–8.5)
eGFR: 81 mL/min/{1.73_m2} (ref >59)

## 2023-08-15 LAB — LIPID PANEL
Chol/HDL Ratio: 3.2 {ratio} (ref 0.0–5.0)
Cholesterol, Total: 134 mg/dL (ref 100–199)
HDL: 42 mg/dL (ref >39)
LDL Chol Calc (NIH): 68 mg/dL (ref 0–99)
Triglycerides: 133 mg/dL (ref 0–149)
VLDL Cholesterol Cal: 24 mg/dL (ref 5–40)

## 2023-08-21 ENCOUNTER — Ambulatory Visit: Payer: Medicare HMO | Admitting: Internal Medicine

## 2023-08-21 ENCOUNTER — Encounter: Payer: Self-pay | Admitting: Internal Medicine

## 2023-08-21 VITALS — BP 112/70 | HR 85 | Temp 97.3°F | Ht 69.0 in | Wt 168.0 lb

## 2023-08-21 DIAGNOSIS — E782 Mixed hyperlipidemia: Secondary | ICD-10-CM | POA: Diagnosis not present

## 2023-08-21 DIAGNOSIS — I10 Essential (primary) hypertension: Secondary | ICD-10-CM | POA: Diagnosis not present

## 2023-08-21 DIAGNOSIS — I951 Orthostatic hypotension: Secondary | ICD-10-CM

## 2023-08-21 DIAGNOSIS — F17210 Nicotine dependence, cigarettes, uncomplicated: Secondary | ICD-10-CM | POA: Diagnosis not present

## 2023-08-21 NOTE — Progress Notes (Signed)
Established Patient Office Visit  Subjective:  Patient ID: Charles Madden, male    DOB: 1941-07-15  Age: 83 y.o. MRN: [id number]  Chief Complaint  Patient presents with   Follow-up    3 Months Lab Results    No new complaints, here for lab review and medication refills. Recently recovered from URI for which he had to visit [facility].     No other concerns at this time.   Past Medical History:  Diagnosis Date   Hyperlipidemia    Hypertension    Psoriasis     Past Surgical History:  Procedure Laterality Date   APPENDECTOMY     CATARACT EXTRACTION W/PHACO Right 06/22/2023   Procedure: CATARACT EXTRACTION PHACO AND INTRAOCULAR LENS PLACEMENT (IOC) RIGHT  9.67  01:01.0;  Surgeon: Lockie Mola, MD;  Location: [facility];  Service: Ophthalmology;  Laterality: Right;   CATARACT EXTRACTION W/PHACO Left 07/05/2023   Procedure: CATARACT EXTRACTION PHACO AND INTRAOCULAR LENS PLACEMENT (IOC) LEFT 7.95 00:54.6;  Surgeon: Lockie Mola, MD;  Location: [facility];  Service: Ophthalmology;  Laterality: Left;    Social History   Socioeconomic History   Marital status: Widowed    Spouse name: Not on file   Number of children: Not on file   Years of education: Not on file   Highest education level: Not on file  Occupational History   Not on file  Tobacco Use   Smoking status: Every Day    Current packs/day: 0.25    Types: Cigarettes   Smokeless tobacco: Never  Substance and Sexual Activity   Alcohol use: Not Currently   Drug use: Never   Sexual activity: Not on file  Other Topics Concern   Not on file  Social History Narrative   Not on file   Social Drivers of Health   Financial Resource Strain: Not on file  Food Insecurity: Not on file  Transportation Needs: Not on file  Physical Activity: Not on file  Stress: Not on file  Social Connections: Not on file  Intimate Partner Violence: Not on file    No family history on file.  No Known  Allergies  Outpatient Medications Prior to Visit  Medication Sig   albuterol (VENTOLIN HFA) 108 (90 Base) MCG/ACT inhaler INHALE 2 PUFFS INTO THE LUNGS EVERY 4 (FOUR) HOURS AS NEEDED FOR WHEEZING OR SHORTNESS OF BREATH.   amLODipine-benazepril (LOTREL) 5-20 MG capsule TAKE 1 CAPSULE EVERY MORNING   atorvastatin (LIPITOR) 10 MG tablet TAKE 1 TABLET EVERY EVENING   folic acid (FOLVITE) 1 MG tablet Take 1 mg by mouth daily.   methotrexate (RHEUMATREX) 2.5 MG tablet Take 5 mg by mouth once a week. Caution:Chemotherapy. Protect from light.   pantoprazole (PROTONIX) 20 MG tablet TAKE 1 TABLET EVERY MORNING   No facility-administered medications prior to visit.    Review of Systems  Constitutional: Negative.  Negative for weight loss.  HENT: Negative.  Negative for congestion, ear discharge, ear pain and tinnitus.   Eyes: Negative.   Respiratory: Negative.    Cardiovascular: Negative.   Gastrointestinal: Negative.   Genitourinary: Negative.   Skin: Negative.   Neurological:  Positive for dizziness.  Endo/Heme/Allergies: Negative.        Objective:   BP 112/70   Pulse 85   Temp (!) 97.3 F (36.3 C) (Tympanic)   Ht 5\' 9"  (1.753 m)   Wt 168 lb (76.2 kg)   SpO2 96%   BMI 24.81 kg/m   Vitals:   08/21/23  0954  BP: 112/70  Pulse: 85  Temp: (!) 97.3 F (36.3 C)  Height: 5\' 9"  (1.753 m)  Weight: 168 lb (76.2 kg)  SpO2: 96%  TempSrc: Tympanic  BMI (Calculated): 24.8    Physical Exam Vitals reviewed.  Constitutional:      Appearance: Normal appearance.  HENT:     Head: Normocephalic.     Left Ear: There is no impacted cerumen.     Nose: Nose normal.     Mouth/Throat:     Mouth: Mucous membranes are moist.     Pharynx: No posterior oropharyngeal erythema.  Eyes:     Extraocular Movements: Extraocular movements intact.     Pupils: Pupils are equal, round, and reactive to light.  Cardiovascular:     Rate and Rhythm: Regular rhythm.     Chest Wall: PMI is not  displaced.     Pulses: Normal pulses.     Heart sounds: Normal heart sounds. No murmur heard. Pulmonary:     Effort: Pulmonary effort is normal.     Breath sounds: Normal air entry. No rhonchi or rales.  Abdominal:     General: Abdomen is flat. Bowel sounds are normal. There is no distension.     Palpations: Abdomen is soft. There is no hepatomegaly, splenomegaly or mass.     Tenderness: There is no abdominal tenderness.  Musculoskeletal:        General: Normal range of motion.     Cervical back: Normal range of motion and neck supple.     Right lower leg: 1+ Pitting Edema present.     Left lower leg: 1+ Edema present.  Skin:    General: Skin is warm and dry.     Comments: Hemosiderin staining of both legs  Neurological:     General: No focal deficit present.     Mental Status: He is alert and oriented to person, place, and time.     Cranial Nerves: No cranial nerve deficit.     Motor: No weakness.  Psychiatric:        Mood and Affect: Mood normal.        Behavior: Behavior normal.      No results found for any visits on 08/21/23.  Recent Results (from the past 2160 hours)  Comprehensive metabolic panel     Status: None   Collection Time: 08/14/23  9:27 AM  Result Value Ref Range   Glucose 97 70 - 99 mg/dL   BUN 10 8 - 27 mg/dL   Creatinine, Ser 1.61 0.76 - 1.27 mg/dL   eGFR 81 >09 UE/AVW/0.98   BUN/Creatinine Ratio 11 10 - 24   Sodium 140 134 - 144 mmol/L   Potassium 4.6 3.5 - 5.2 mmol/L   Chloride 104 96 - 106 mmol/L   CO2 23 20 - 29 mmol/L   Calcium 9.2 8.6 - 10.2 mg/dL   Total Protein 6.0 6.0 - 8.5 g/dL   Albumin 4.1 3.7 - 4.7 g/dL   Globulin, Total 1.9 1.5 - 4.5 g/dL   Bilirubin Total 0.4 0.0 - 1.2 mg/dL   Alkaline Phosphatase 85 44 - 121 IU/L   AST 20 0 - 40 IU/L   ALT 16 0 - 44 IU/L  Lipid panel     Status: None   Collection Time: 08/14/23  9:27 AM  Result Value Ref Range   Cholesterol, Total 134 100 - 199 mg/dL   Triglycerides 119 0 - 149 mg/dL   HDL  42 >14 mg/dL  VLDL Cholesterol Cal 24 5 - 40 mg/dL   LDL Chol Calc (NIH) 68 0 - 99 mg/dL   Chol/HDL Ratio 3.2 0.0 - 5.0 ratio    Comment:                                   T. Chol/HDL Ratio                                             Men  Women                               1/2 Avg.Risk  3.4    3.3                                   Avg.Risk  5.0    4.4                                2X Avg.Risk  9.6    7.1                                3X Avg.Risk 23.4   11.0       Assessment & Plan:  As per problem list. The current medical regimen is effective;  continue present plan and medications.Smoking cessation instruction/counseling given:  counseled patient on the dangers of tobacco use, advised patient to stop smoking, and reviewed strategies to maximize success   Problem List Items Addressed This Visit       Cardiovascular and Mediastinum   Primary hypertension - Primary   Relevant Orders   CBC With Diff/Platelet     Other   Mixed hyperlipidemia   Other Visit Diagnoses       Orthostatic hypotension           Return in about 3 months (around 11/18/2023) for awv with labs prior.   Total time spent: 20 minutes  Luna Fuse, MD  08/21/2023   This document may have been prepared by [facility] Voice Recognition software and as such may include unintentional dictation errors.

## 2023-08-28 DIAGNOSIS — Z79899 Other long term (current) drug therapy: Secondary | ICD-10-CM | POA: Diagnosis not present

## 2023-08-28 DIAGNOSIS — D485 Neoplasm of uncertain behavior of skin: Secondary | ICD-10-CM | POA: Diagnosis not present

## 2023-08-28 DIAGNOSIS — L409 Psoriasis, unspecified: Secondary | ICD-10-CM | POA: Diagnosis not present

## 2023-08-28 DIAGNOSIS — D044 Carcinoma in situ of skin of scalp and neck: Secondary | ICD-10-CM | POA: Diagnosis not present

## 2023-08-28 DIAGNOSIS — D2262 Melanocytic nevi of left upper limb, including shoulder: Secondary | ICD-10-CM | POA: Diagnosis not present

## 2023-08-28 DIAGNOSIS — D2271 Melanocytic nevi of right lower limb, including hip: Secondary | ICD-10-CM | POA: Diagnosis not present

## 2023-08-28 DIAGNOSIS — D0439 Carcinoma in situ of skin of other parts of face: Secondary | ICD-10-CM | POA: Diagnosis not present

## 2023-08-28 DIAGNOSIS — D2261 Melanocytic nevi of right upper limb, including shoulder: Secondary | ICD-10-CM | POA: Diagnosis not present

## 2023-08-28 DIAGNOSIS — Z85828 Personal history of other malignant neoplasm of skin: Secondary | ICD-10-CM | POA: Diagnosis not present

## 2023-08-28 DIAGNOSIS — L570 Actinic keratosis: Secondary | ICD-10-CM | POA: Diagnosis not present

## 2023-08-28 DIAGNOSIS — L400 Psoriasis vulgaris: Secondary | ICD-10-CM | POA: Diagnosis not present

## 2023-08-28 DIAGNOSIS — D2272 Melanocytic nevi of left lower limb, including hip: Secondary | ICD-10-CM | POA: Diagnosis not present

## 2023-08-30 DIAGNOSIS — Z79899 Other long term (current) drug therapy: Secondary | ICD-10-CM | POA: Diagnosis not present

## 2023-10-05 DIAGNOSIS — D0439 Carcinoma in situ of skin of other parts of face: Secondary | ICD-10-CM | POA: Diagnosis not present

## 2023-10-12 DIAGNOSIS — D044 Carcinoma in situ of skin of scalp and neck: Secondary | ICD-10-CM | POA: Diagnosis not present

## 2023-11-15 ENCOUNTER — Other Ambulatory Visit: Payer: Self-pay | Admitting: Internal Medicine

## 2023-11-15 ENCOUNTER — Other Ambulatory Visit

## 2023-11-15 DIAGNOSIS — I10 Essential (primary) hypertension: Secondary | ICD-10-CM | POA: Diagnosis not present

## 2023-11-15 DIAGNOSIS — E782 Mixed hyperlipidemia: Secondary | ICD-10-CM | POA: Diagnosis not present

## 2023-11-16 LAB — COMPREHENSIVE METABOLIC PANEL WITH GFR
ALT: 21 IU/L (ref 0–44)
AST: 24 IU/L (ref 0–40)
Albumin: 4.2 g/dL (ref 3.7–4.7)
Alkaline Phosphatase: 74 IU/L (ref 44–121)
BUN/Creatinine Ratio: 14 (ref 10–24)
BUN: 12 mg/dL (ref 8–27)
Bilirubin Total: 0.4 mg/dL (ref 0.0–1.2)
CO2: 23 mmol/L (ref 20–29)
Calcium: 9.2 mg/dL (ref 8.6–10.2)
Chloride: 105 mmol/L (ref 96–106)
Creatinine, Ser: 0.85 mg/dL (ref 0.76–1.27)
Globulin, Total: 1.9 g/dL (ref 1.5–4.5)
Glucose: 98 mg/dL (ref 70–99)
Potassium: 4.2 mmol/L (ref 3.5–5.2)
Sodium: 142 mmol/L (ref 134–144)
Total Protein: 6.1 g/dL (ref 6.0–8.5)
eGFR: 87 mL/min/{1.73_m2} (ref >59)

## 2023-11-16 LAB — CBC WITH DIFF/PLATELET
Basophils Absolute: 0.1 10*3/uL (ref 0.0–0.2)
Basos: 2 %
EOS (ABSOLUTE): 0.3 10*3/uL (ref 0.0–0.4)
Eos: 6 %
Hematocrit: 44.9 % (ref 37.5–51.0)
Hemoglobin: 15.2 g/dL (ref 13.0–17.7)
Immature Grans (Abs): 0 10*3/uL (ref 0.0–0.1)
Immature Granulocytes: 0 %
Lymphocytes Absolute: 1.5 10*3/uL (ref 0.7–3.1)
Lymphs: 27 %
MCH: 33.6 pg — ABNORMAL HIGH (ref 26.6–33.0)
MCHC: 33.9 g/dL (ref 31.5–35.7)
MCV: 99 fL — ABNORMAL HIGH (ref 79–97)
Monocytes Absolute: 0.5 10*3/uL (ref 0.1–0.9)
Monocytes: 9 %
Neutrophils Absolute: 3.1 10*3/uL (ref 1.4–7.0)
Neutrophils: 56 %
Platelets: 178 10*3/uL (ref 150–450)
RBC: 4.53 x10E6/uL (ref 4.14–5.80)
RDW: 13.2 % (ref 11.6–15.4)
WBC: 5.4 10*3/uL (ref 3.4–10.8)

## 2023-11-16 LAB — LIPID PANEL
Chol/HDL Ratio: 2.9 ratio (ref 0.0–5.0)
Cholesterol, Total: 142 mg/dL (ref 100–199)
HDL: 49 mg/dL (ref >39)
LDL Chol Calc (NIH): 71 mg/dL (ref 0–99)
Triglycerides: 127 mg/dL (ref 0–149)
VLDL Cholesterol Cal: 22 mg/dL (ref 5–40)

## 2023-11-22 ENCOUNTER — Ambulatory Visit (INDEPENDENT_AMBULATORY_CARE_PROVIDER_SITE_OTHER): Payer: Medicare HMO | Admitting: Internal Medicine

## 2023-11-22 ENCOUNTER — Encounter: Payer: Self-pay | Admitting: Internal Medicine

## 2023-11-22 DIAGNOSIS — E782 Mixed hyperlipidemia: Secondary | ICD-10-CM | POA: Diagnosis not present

## 2023-11-22 DIAGNOSIS — I10 Essential (primary) hypertension: Secondary | ICD-10-CM

## 2023-11-22 MED ORDER — AMLODIPINE BESY-BENAZEPRIL HCL 5-20 MG PO CAPS: 1.0000 | ORAL_CAPSULE | Freq: Every morning | ORAL | 1 refills | Status: DC

## 2023-11-22 MED ORDER — ALBUTEROL SULFATE HFA 108 (90 BASE) MCG/ACT IN AERS: 2.0000 | INHALATION_SPRAY | RESPIRATORY_TRACT | 3 refills | Status: DC | PRN

## 2023-11-22 MED ORDER — ATORVASTATIN CALCIUM 10 MG PO TABS: 10.0000 mg | ORAL_TABLET | Freq: Every evening | ORAL | 1 refills | Status: DC

## 2023-11-22 NOTE — Progress Notes (Signed)
 Established Patient Office Visit  Subjective:  Patient ID: Charles Madden, male    DOB: 21-Mar-1941  Age: 83 y.o. MRN: [id number]  Chief Complaint  Patient presents with   Annual Exam    AWV/Labs    No new complaints, here for AWV refer to quality metrics and scanned documents.       No other concerns at this time.   Past Medical History:  Diagnosis Date   Hyperlipidemia    Hypertension    Psoriasis     Past Surgical History:  Procedure Laterality Date   APPENDECTOMY     CATARACT EXTRACTION W/PHACO Right 06/22/2023   Procedure: CATARACT EXTRACTION PHACO AND INTRAOCULAR LENS PLACEMENT (IOC) RIGHT  9.67  01:01.0;  Surgeon: Annell Kidney, MD;  Location: [facility];  Service: Ophthalmology;  Laterality: Right;   CATARACT EXTRACTION W/PHACO Left 07/05/2023   Procedure: CATARACT EXTRACTION PHACO AND INTRAOCULAR LENS PLACEMENT (IOC) LEFT 7.95 00:54.6;  Surgeon: Annell Kidney, MD;  Location: [facility];  Service: Ophthalmology;  Laterality: Left;    Social History   Socioeconomic History   Marital status: Widowed    Spouse name: Not on file   Number of children: Not on file   Years of education: Not on file   Highest education level: Not on file  Occupational History   Not on file  Tobacco Use   Smoking status: Every Day    Current packs/day: 0.25    Types: Cigarettes   Smokeless tobacco: Never  Substance and Sexual Activity   Alcohol use: Not Currently   Drug use: Never   Sexual activity: Not on file  Other Topics Concern   Not on file  Social History Narrative   Not on file   Social Drivers of Health   Financial Resource Strain: Not on file  Food Insecurity: Not on file  Transportation Needs: Not on file  Physical Activity: Not on file  Stress: Not on file  Social Connections: Not on file  Intimate Partner Violence: Not on file    No family history on file.  No Known Allergies  Outpatient Medications Prior to Visit   Medication Sig   folic acid (FOLVITE) 1 MG tablet Take 1 mg by mouth daily.   methotrexate (RHEUMATREX) 2.5 MG tablet Take 5 mg by mouth once a week. Caution:Chemotherapy. Protect from light.   pantoprazole (PROTONIX) 20 MG tablet TAKE 1 TABLET EVERY MORNING   [DISCONTINUED] albuterol  (VENTOLIN  HFA) 108 (90 Base) MCG/ACT inhaler INHALE 2 PUFFS INTO THE LUNGS EVERY 4 (FOUR) HOURS AS NEEDED FOR WHEEZING OR SHORTNESS OF BREATH.   [DISCONTINUED] amLODipine -benazepril  (LOTREL) 5-20 MG capsule TAKE 1 CAPSULE EVERY MORNING   [DISCONTINUED] atorvastatin  (LIPITOR) 10 MG tablet TAKE 1 TABLET EVERY EVENING   No facility-administered medications prior to visit.    Review of Systems  Constitutional: Negative.  Negative for weight loss.  HENT: Negative.  Negative for congestion, ear discharge, ear pain and tinnitus.   Eyes: Negative.   Respiratory: Negative.    Cardiovascular: Negative.   Gastrointestinal: Negative.   Genitourinary: Negative.   Skin: Negative.   Neurological:  Positive for dizziness.  Endo/Heme/Allergies: Negative.        Objective:   BP (!) 140/70   Pulse 98   Temp (!) 96.5 F (35.8 C)   Ht 5\' 9"  (1.753 m)   Wt 170 lb 12.8 oz (77.5 kg)   SpO2 97%   BMI 25.22 kg/m   Vitals:   11/22/23 0956  BP: Aaron Aas)  140/70  Pulse: 98  Temp: (!) 96.5 F (35.8 C)  Height: 5\' 9"  (1.753 m)  Weight: 170 lb 12.8 oz (77.5 kg)  SpO2: 97%  BMI (Calculated): 25.21    Physical Exam Vitals reviewed.  Constitutional:      Appearance: Normal appearance.  HENT:     Head: Normocephalic.     Left Ear: There is no impacted cerumen.     Nose: Nose normal.     Mouth/Throat:     Mouth: Mucous membranes are moist.     Pharynx: No posterior oropharyngeal erythema.  Eyes:     Extraocular Movements: Extraocular movements intact.     Pupils: Pupils are equal, round, and reactive to light.  Cardiovascular:     Rate and Rhythm: Regular rhythm.     Chest Wall: PMI is not displaced.      Pulses: Normal pulses.     Heart sounds: Normal heart sounds. No murmur heard. Pulmonary:     Effort: Pulmonary effort is normal.     Breath sounds: Normal air entry. No rhonchi or rales.  Abdominal:     General: Abdomen is flat. Bowel sounds are normal. There is no distension.     Palpations: Abdomen is soft. There is no hepatomegaly, splenomegaly or mass.     Tenderness: There is no abdominal tenderness.  Musculoskeletal:        General: Normal range of motion.     Cervical back: Normal range of motion and neck supple.     Right lower leg: 1+ Pitting Edema present.     Left lower leg: 1+ Edema present.  Skin:    General: Skin is warm and dry.     Comments: Hemosiderin staining of both legs  Neurological:     General: No focal deficit present.     Mental Status: He is alert and oriented to person, place, and time.     Cranial Nerves: No cranial nerve deficit.     Motor: No weakness.  Psychiatric:        Mood and Affect: Mood normal.        Behavior: Behavior normal.      No results found for any visits on 11/22/23.  Recent Results (from the past 2160 hours)  CBC With Diff/Platelet     Status: Abnormal   Collection Time: 11/15/23  8:28 AM  Result Value Ref Range   WBC 5.4 3.4 - 10.8 x10E3/uL   RBC 4.53 4.14 - 5.80 x10E6/uL   Hemoglobin 15.2 13.0 - 17.7 g/dL   Hematocrit 16.1 09.6 - 51.0 %   MCV 99 (H) 79 - 97 fL   MCH 33.6 (H) 26.6 - 33.0 pg   MCHC 33.9 31.5 - 35.7 g/dL   RDW 04.5 40.9 - 81.1 %   Platelets 178 150 - 450 x10E3/uL   Neutrophils 56 Not Estab. %   Lymphs 27 Not Estab. %   Monocytes 9 Not Estab. %   Eos 6 Not Estab. %   Basos 2 Not Estab. %   Neutrophils Absolute 3.1 1.4 - 7.0 x10E3/uL   Lymphocytes Absolute 1.5 0.7 - 3.1 x10E3/uL   Monocytes Absolute 0.5 0.1 - 0.9 x10E3/uL   EOS (ABSOLUTE) 0.3 0.0 - 0.4 x10E3/uL   Basophils Absolute 0.1 0.0 - 0.2 x10E3/uL   Immature Granulocytes 0 Not Estab. %   Immature Grans (Abs) 0.0 0.0 - 0.1 x10E3/uL   Comprehensive metabolic panel with GFR     Status: None   Collection Time: 11/15/23  8:28 AM  Result Value Ref Range   Glucose 98 70 - 99 mg/dL   BUN 12 8 - 27 mg/dL   Creatinine, Ser 7.82 0.76 - 1.27 mg/dL   eGFR 87 >95 AO/ZHY/8.65   BUN/Creatinine Ratio 14 10 - 24   Sodium 142 134 - 144 mmol/L   Potassium 4.2 3.5 - 5.2 mmol/L   Chloride 105 96 - 106 mmol/L   CO2 23 20 - 29 mmol/L   Calcium  9.2 8.6 - 10.2 mg/dL   Total Protein 6.1 6.0 - 8.5 g/dL   Albumin 4.2 3.7 - 4.7 g/dL   Globulin, Total 1.9 1.5 - 4.5 g/dL   Bilirubin Total 0.4 0.0 - 1.2 mg/dL   Alkaline Phosphatase 74 44 - 121 IU/L   AST 24 0 - 40 IU/L   ALT 21 0 - 44 IU/L  Lipid panel     Status: None   Collection Time: 11/15/23  8:28 AM  Result Value Ref Range   Cholesterol, Total 142 100 - 199 mg/dL   Triglycerides 784 0 - 149 mg/dL   HDL 49 >69 mg/dL   VLDL Cholesterol Cal 22 5 - 40 mg/dL   LDL Chol Calc (NIH) 71 0 - 99 mg/dL   Chol/HDL Ratio 2.9 0.0 - 5.0 ratio    Comment:                                   T. Chol/HDL Ratio                                             Men  Women                               1/2 Avg.Risk  3.4    3.3                                   Avg.Risk  5.0    4.4                                2X Avg.Risk  9.6    7.1                                3X Avg.Risk 23.4   11.0       Assessment & Plan:  As per problem list.  Problem List Items Addressed This Visit       Cardiovascular and Mediastinum   Primary hypertension   Relevant Medications   amLODipine -benazepril  (LOTREL) 5-20 MG capsule   atorvastatin  (LIPITOR) 10 MG tablet     Other   Mixed hyperlipidemia   Relevant Medications   amLODipine -benazepril  (LOTREL) 5-20 MG capsule   atorvastatin  (LIPITOR) 10 MG tablet    Return in 3 months (on 02/22/2024).   Total time spent: 30 minutes  Arzella Bitters, MD  11/22/2023   This document may have been prepared by [facility] Voice Recognition software and as such may  include unintentional dictation errors.

## 2023-12-30 ENCOUNTER — Other Ambulatory Visit: Payer: Self-pay | Admitting: Internal Medicine

## 2023-12-30 DIAGNOSIS — I10 Essential (primary) hypertension: Secondary | ICD-10-CM

## 2024-01-31 ENCOUNTER — Other Ambulatory Visit: Payer: Self-pay | Admitting: Internal Medicine

## 2024-01-31 DIAGNOSIS — E782 Mixed hyperlipidemia: Secondary | ICD-10-CM

## 2024-02-19 ENCOUNTER — Other Ambulatory Visit: Payer: Self-pay | Admitting: Internal Medicine

## 2024-02-19 ENCOUNTER — Other Ambulatory Visit

## 2024-02-19 DIAGNOSIS — E782 Mixed hyperlipidemia: Secondary | ICD-10-CM | POA: Diagnosis not present

## 2024-02-20 LAB — LIPID PANEL
Chol/HDL Ratio: 2.7 ratio (ref 0.0–5.0)
Cholesterol, Total: 134 mg/dL (ref 100–199)
HDL: 50 mg/dL (ref >39)
LDL Chol Calc (NIH): 60 mg/dL (ref 0–99)
Triglycerides: 136 mg/dL (ref 0–149)
VLDL Cholesterol Cal: 24 mg/dL (ref 5–40)

## 2024-02-20 LAB — COMPREHENSIVE METABOLIC PANEL WITH GFR
ALT: 16 IU/L (ref 0–44)
AST: 16 IU/L (ref 0–40)
Albumin: 4.3 g/dL (ref 3.7–4.7)
Alkaline Phosphatase: 73 IU/L (ref 44–121)
BUN/Creatinine Ratio: 18 (ref 10–24)
BUN: 16 mg/dL (ref 8–27)
Bilirubin Total: 0.4 mg/dL (ref 0.0–1.2)
CO2: 21 mmol/L (ref 20–29)
Calcium: 9.1 mg/dL (ref 8.6–10.2)
Chloride: 105 mmol/L (ref 96–106)
Creatinine, Ser: 0.91 mg/dL (ref 0.76–1.27)
Globulin, Total: 2 g/dL (ref 1.5–4.5)
Glucose: 97 mg/dL (ref 70–99)
Potassium: 4.4 mmol/L (ref 3.5–5.2)
Sodium: 141 mmol/L (ref 134–144)
Total Protein: 6.3 g/dL (ref 6.0–8.5)
eGFR: 84 mL/min/1.73 (ref >59)

## 2024-02-23 ENCOUNTER — Ambulatory Visit: Admitting: Internal Medicine

## 2024-02-23 ENCOUNTER — Ambulatory Visit: Payer: Self-pay | Admitting: Internal Medicine

## 2024-02-23 ENCOUNTER — Encounter: Payer: Self-pay | Admitting: Internal Medicine

## 2024-02-23 VITALS — BP 120/70 | HR 85 | Ht 69.0 in | Wt 170.6 lb

## 2024-02-23 DIAGNOSIS — J410 Simple chronic bronchitis: Secondary | ICD-10-CM | POA: Insufficient documentation

## 2024-02-23 DIAGNOSIS — E782 Mixed hyperlipidemia: Secondary | ICD-10-CM

## 2024-02-23 DIAGNOSIS — J418 Mixed simple and mucopurulent chronic bronchitis: Secondary | ICD-10-CM

## 2024-02-23 DIAGNOSIS — I10 Essential (primary) hypertension: Secondary | ICD-10-CM | POA: Diagnosis not present

## 2024-02-23 DIAGNOSIS — R42 Dizziness and giddiness: Secondary | ICD-10-CM | POA: Diagnosis not present

## 2024-02-23 MED ORDER — ALBUTEROL SULFATE HFA 108 (90 BASE) MCG/ACT IN AERS: 2.0000 | INHALATION_SPRAY | RESPIRATORY_TRACT | 3 refills | Status: AC | PRN

## 2024-02-23 MED ORDER — MECLIZINE HCL 25 MG PO TABS: 25.0000 mg | ORAL_TABLET | Freq: Three times a day (TID) | ORAL | 0 refills | Status: AC | PRN

## 2024-02-23 MED ORDER — AMLODIPINE BESY-BENAZEPRIL HCL 5-20 MG PO CAPS
1.0000 | ORAL_CAPSULE | Freq: Every morning | ORAL | 1 refills | Status: DC
Start: 2024-02-23 — End: 2024-05-20

## 2024-02-23 NOTE — Progress Notes (Signed)
 Established Patient Office Visit  Subjective:  Patient ID: Charles Madden, male    DOB: 06/01/1941  Age: 83 y.o. MRN: [id number]  Chief Complaint  Patient presents with   Follow-up    3 month follow up     No new complaints, here for lab review and medication refills.  reports occasional dizzy spells.LDL and TC well controlled on lab review. Triglycerides also satisfactory with satisfactory CMP.    No other concerns at this time.   Past Medical History:  Diagnosis Date   Hyperlipidemia    Hypertension    Psoriasis     Past Surgical History:  Procedure Laterality Date   APPENDECTOMY     CATARACT EXTRACTION W/PHACO Right 06/22/2023   Procedure: CATARACT EXTRACTION PHACO AND INTRAOCULAR LENS PLACEMENT (IOC) RIGHT  9.67  01:01.0;  Surgeon: Mittie Gaskin, MD;  Location: [facility];  Service: Ophthalmology;  Laterality: Right;   CATARACT EXTRACTION W/PHACO Left 07/05/2023   Procedure: CATARACT EXTRACTION PHACO AND INTRAOCULAR LENS PLACEMENT (IOC) LEFT 7.95 00:54.6;  Surgeon: Mittie Gaskin, MD;  Location: [facility];  Service: Ophthalmology;  Laterality: Left;    Social History   Socioeconomic History   Marital status: Widowed    Spouse name: Not on file   Number of children: Not on file   Years of education: Not on file   Highest education level: Not on file  Occupational History   Not on file  Tobacco Use   Smoking status: Every Day    Current packs/day: 0.25    Types: Cigarettes   Smokeless tobacco: Never  Substance and Sexual Activity   Alcohol use: Not Currently   Drug use: Never   Sexual activity: Not on file  Other Topics Concern   Not on file  Social History Narrative   Not on file   Social Drivers of Health   Financial Resource Strain: Not on file  Food Insecurity: Not on file  Transportation Needs: Not on file  Physical Activity: Not on file  Stress: Not on file  Social Connections: Not on file  Intimate Partner  Violence: Not on file    No family history on file.  No Known Allergies  Outpatient Medications Prior to Visit  Medication Sig   atorvastatin  (LIPITOR) 10 MG tablet TAKE 1 TABLET EVERY EVENING   folic acid (FOLVITE) 1 MG tablet Take 1 mg by mouth daily.   methotrexate (RHEUMATREX) 2.5 MG tablet Take 5 mg by mouth once a week. Caution:Chemotherapy. Protect from light.   pantoprazole (PROTONIX) 20 MG tablet TAKE 1 TABLET EVERY MORNING   [DISCONTINUED] albuterol  (VENTOLIN  HFA) 108 (90 Base) MCG/ACT inhaler Inhale 2 puffs into the lungs every 4 (four) hours as needed for wheezing or shortness of breath.   [DISCONTINUED] amLODipine -benazepril  (LOTREL) 5-20 MG capsule TAKE 1 CAPSULE EVERY MORNING   No facility-administered medications prior to visit.    Review of Systems  Constitutional: Negative.  Negative for weight loss.  HENT: Negative.  Negative for congestion, ear discharge, ear pain and tinnitus.   Eyes: Negative.   Respiratory: Negative.    Cardiovascular: Negative.   Gastrointestinal: Negative.   Genitourinary: Negative.   Skin: Negative.   Neurological:  Positive for dizziness.  Endo/Heme/Allergies: Negative.        Objective:   BP 120/70 (Cuff Size: Normal)   Pulse 85   Ht 5' 9 (1.753 m)   Wt 170 lb 9.6 oz (77.4 kg)   SpO2 97%   BMI 25.19 kg/m  Vitals:   02/23/24 1013 02/23/24 1115  BP: 105/67 120/70  Pulse: 85   Height: 5' 9 (1.753 m)   Weight: 170 lb 9.6 oz (77.4 kg)   SpO2: 97%   BMI (Calculated): 25.18     Physical Exam Vitals reviewed.  Constitutional:      Appearance: Normal appearance.  HENT:     Head: Normocephalic.     Left Ear: There is no impacted cerumen.     Nose: Nose normal.     Mouth/Throat:     Mouth: Mucous membranes are moist.     Pharynx: No posterior oropharyngeal erythema.  Eyes:     Extraocular Movements: Extraocular movements intact.     Pupils: Pupils are equal, round, and reactive to light.  Cardiovascular:      Rate and Rhythm: Regular rhythm.     Chest Wall: PMI is not displaced.     Pulses: Normal pulses.     Heart sounds: Normal heart sounds. No murmur heard. Pulmonary:     Effort: Pulmonary effort is normal.     Breath sounds: Normal air entry. No rhonchi or rales.  Abdominal:     General: Abdomen is flat. Bowel sounds are normal. There is no distension.     Palpations: Abdomen is soft. There is no hepatomegaly, splenomegaly or mass.     Tenderness: There is no abdominal tenderness.  Musculoskeletal:        General: Normal range of motion.     Cervical back: Normal range of motion and neck supple.     Right lower leg: 1+ Pitting Edema present.     Left lower leg: 1+ Edema present.  Skin:    General: Skin is warm and dry.     Comments: Hemosiderin staining of both legs  Neurological:     General: No focal deficit present.     Mental Status: He is alert and oriented to person, place, and time.     Cranial Nerves: No cranial nerve deficit.     Motor: No weakness.  Psychiatric:        Mood and Affect: Mood normal.        Behavior: Behavior normal.      No results found for any visits on 02/23/24.  Recent Results (from the past 2160 hours)  Comprehensive metabolic panel with GFR     Status: None   Collection Time: 02/19/24  8:18 AM  Result Value Ref Range   Glucose 97 70 - 99 mg/dL   BUN 16 8 - 27 mg/dL   Creatinine, Ser 9.08 0.76 - 1.27 mg/dL   eGFR 84 >40 fO/fpw/8.26   BUN/Creatinine Ratio 18 10 - 24   Sodium 141 134 - 144 mmol/L   Potassium 4.4 3.5 - 5.2 mmol/L   Chloride 105 96 - 106 mmol/L   CO2 21 20 - 29 mmol/L   Calcium  9.1 8.6 - 10.2 mg/dL   Total Protein 6.3 6.0 - 8.5 g/dL   Albumin 4.3 3.7 - 4.7 g/dL   Globulin, Total 2.0 1.5 - 4.5 g/dL   Bilirubin Total 0.4 0.0 - 1.2 mg/dL   Alkaline Phosphatase 73 44 - 121 IU/L   AST 16 0 - 40 IU/L   ALT 16 0 - 44 IU/L  Lipid panel     Status: None   Collection Time: 02/19/24  8:18 AM  Result Value Ref Range    Cholesterol, Total 134 100 - 199 mg/dL   Triglycerides 863 0 - 149 mg/dL  HDL 50 >39 mg/dL   VLDL Cholesterol Cal 24 5 - 40 mg/dL   LDL Chol Calc (NIH) 60 0 - 99 mg/dL   Chol/HDL Ratio 2.7 0.0 - 5.0 ratio    Comment:                                   T. Chol/HDL Ratio                                             Men  Women                               1/2 Avg.Risk  3.4    3.3                                   Avg.Risk  5.0    4.4                                2X Avg.Risk  9.6    7.1                                3X Avg.Risk 23.4   11.0       Assessment & Plan:  Bodhi was seen today for follow-up.  Mixed hyperlipidemia  Primary hypertension -     amLODIPine  Besy-Benazepril  HCl; Take 1 capsule by mouth every morning.  Dispense: 90 capsule; Refill: 1  Mixed simple and mucopurulent chronic bronchitis (HCC) -     Albuterol  Sulfate HFA; Inhale 2 puffs into the lungs every 4 (four) hours as needed for wheezing or shortness of breath.  Dispense: 18 g; Refill: 3   Smoking cessation instruction/counseling given:  counseled patient on the dangers of tobacco use, advised patient to stop smoking, and reviewed strategies to maximize success  Problem List Items Addressed This Visit       Cardiovascular and Mediastinum   Primary hypertension   Relevant Medications   amLODipine -benazepril  (LOTREL) 5-20 MG capsule     Respiratory   Simple chronic bronchitis (HCC)   Relevant Medications   albuterol  (VENTOLIN  HFA) 108 (90 Base) MCG/ACT inhaler     Other   Mixed hyperlipidemia - Primary   Relevant Medications   amLODipine -benazepril  (LOTREL) 5-20 MG capsule    Return in about 3 months (around 05/25/2024).   Total time spent: 20 minutes  Sherrill Cinderella Perry, MD  02/23/2024   This document may have been prepared by [facility] Voice Recognition software and as such may include unintentional dictation errors.

## 2024-03-12 DIAGNOSIS — D3131 Benign neoplasm of right choroid: Secondary | ICD-10-CM | POA: Diagnosis not present

## 2024-03-12 DIAGNOSIS — D3132 Benign neoplasm of left choroid: Secondary | ICD-10-CM | POA: Diagnosis not present

## 2024-03-12 DIAGNOSIS — Z961 Presence of intraocular lens: Secondary | ICD-10-CM | POA: Diagnosis not present

## 2024-04-25 DIAGNOSIS — L570 Actinic keratosis: Secondary | ICD-10-CM | POA: Diagnosis not present

## 2024-04-25 DIAGNOSIS — D2262 Melanocytic nevi of left upper limb, including shoulder: Secondary | ICD-10-CM | POA: Diagnosis not present

## 2024-04-25 DIAGNOSIS — Z79899 Other long term (current) drug therapy: Secondary | ICD-10-CM | POA: Diagnosis not present

## 2024-04-25 DIAGNOSIS — D2272 Melanocytic nevi of left lower limb, including hip: Secondary | ICD-10-CM | POA: Diagnosis not present

## 2024-04-25 DIAGNOSIS — D2261 Melanocytic nevi of right upper limb, including shoulder: Secondary | ICD-10-CM | POA: Diagnosis not present

## 2024-04-25 DIAGNOSIS — L400 Psoriasis vulgaris: Secondary | ICD-10-CM | POA: Diagnosis not present

## 2024-04-25 DIAGNOSIS — D2271 Melanocytic nevi of right lower limb, including hip: Secondary | ICD-10-CM | POA: Diagnosis not present

## 2024-04-25 DIAGNOSIS — Z85828 Personal history of other malignant neoplasm of skin: Secondary | ICD-10-CM | POA: Diagnosis not present

## 2024-04-30 DIAGNOSIS — H9193 Unspecified hearing loss, bilateral: Secondary | ICD-10-CM | POA: Diagnosis not present

## 2024-04-30 DIAGNOSIS — E785 Hyperlipidemia, unspecified: Secondary | ICD-10-CM | POA: Diagnosis not present

## 2024-04-30 DIAGNOSIS — I129 Hypertensive chronic kidney disease with stage 1 through stage 4 chronic kidney disease, or unspecified chronic kidney disease: Secondary | ICD-10-CM | POA: Diagnosis not present

## 2024-04-30 DIAGNOSIS — L309 Dermatitis, unspecified: Secondary | ICD-10-CM | POA: Diagnosis not present

## 2024-04-30 DIAGNOSIS — M810 Age-related osteoporosis without current pathological fracture: Secondary | ICD-10-CM | POA: Diagnosis not present

## 2024-04-30 DIAGNOSIS — I872 Venous insufficiency (chronic) (peripheral): Secondary | ICD-10-CM | POA: Diagnosis not present

## 2024-04-30 DIAGNOSIS — D529 Folate deficiency anemia, unspecified: Secondary | ICD-10-CM | POA: Diagnosis not present

## 2024-04-30 DIAGNOSIS — M519 Unspecified thoracic, thoracolumbar and lumbosacral intervertebral disc disorder: Secondary | ICD-10-CM | POA: Diagnosis not present

## 2024-04-30 DIAGNOSIS — N182 Chronic kidney disease, stage 2 (mild): Secondary | ICD-10-CM | POA: Diagnosis not present

## 2024-04-30 DIAGNOSIS — K219 Gastro-esophageal reflux disease without esophagitis: Secondary | ICD-10-CM | POA: Diagnosis not present

## 2024-04-30 DIAGNOSIS — Z79631 Long term (current) use of antimetabolite agent: Secondary | ICD-10-CM | POA: Diagnosis not present

## 2024-04-30 DIAGNOSIS — M5430 Sciatica, unspecified side: Secondary | ICD-10-CM | POA: Diagnosis not present

## 2024-04-30 DIAGNOSIS — L400 Psoriasis vulgaris: Secondary | ICD-10-CM | POA: Diagnosis not present

## 2024-05-01 ENCOUNTER — Telehealth: Payer: Self-pay | Admitting: Internal Medicine

## 2024-05-01 NOTE — Telephone Encounter (Signed)
 Patient called in stating he hasn't felt good in a couple of weeks and has been using aspirin, Dayquil, and nasal spray. States his head is stopped up, has no energy, and just doesn't feel good. Denies fever, chills, body aches. Please send an antibiotic if appropriate or let us  know if he needs to come in.   [facility] - Arlyss Buba

## 2024-05-03 ENCOUNTER — Other Ambulatory Visit: Payer: Self-pay | Admitting: Internal Medicine

## 2024-05-03 DIAGNOSIS — J301 Allergic rhinitis due to pollen: Secondary | ICD-10-CM

## 2024-05-03 MED ORDER — FLUTICASONE PROPIONATE 50 MCG/ACT NA SUSP: 1.0000 | Freq: Every day | NASAL | 0 refills | Status: AC

## 2024-05-03 MED ORDER — CETIRIZINE HCL 10 MG PO TABS: 10.0000 mg | ORAL_TABLET | Freq: Every day | ORAL | 0 refills | Status: AC

## 2024-05-07 NOTE — Telephone Encounter (Signed)
Pt informed

## 2024-05-19 ENCOUNTER — Other Ambulatory Visit: Payer: Self-pay | Admitting: Internal Medicine

## 2024-05-19 DIAGNOSIS — I10 Essential (primary) hypertension: Secondary | ICD-10-CM

## 2024-05-24 ENCOUNTER — Other Ambulatory Visit

## 2024-05-24 DIAGNOSIS — I10 Essential (primary) hypertension: Secondary | ICD-10-CM | POA: Diagnosis not present

## 2024-05-24 DIAGNOSIS — R7301 Impaired fasting glucose: Secondary | ICD-10-CM

## 2024-05-24 DIAGNOSIS — I951 Orthostatic hypotension: Secondary | ICD-10-CM

## 2024-05-24 DIAGNOSIS — E782 Mixed hyperlipidemia: Secondary | ICD-10-CM

## 2024-05-25 LAB — CMP14+EGFR
ALT: 17 IU/L (ref 0–44)
AST: 21 IU/L (ref 0–40)
Albumin: 4.5 g/dL (ref 3.7–4.7)
Alkaline Phosphatase: 76 IU/L (ref 48–129)
BUN/Creatinine Ratio: 16 (ref 10–24)
BUN: 14 mg/dL (ref 8–27)
Bilirubin Total: 0.5 mg/dL (ref 0.0–1.2)
CO2: 24 mmol/L (ref 20–29)
Calcium: 9.2 mg/dL (ref 8.6–10.2)
Chloride: 103 mmol/L (ref 96–106)
Creatinine, Ser: 0.86 mg/dL (ref 0.76–1.27)
Globulin, Total: 2.2 g/dL (ref 1.5–4.5)
Glucose: 96 mg/dL (ref 70–99)
Potassium: 4.3 mmol/L (ref 3.5–5.2)
Sodium: 140 mmol/L (ref 134–144)
Total Protein: 6.7 g/dL (ref 6.0–8.5)
eGFR: 86 mL/min/1.73 (ref >59)

## 2024-05-25 LAB — CBC WITH DIFFERENTIAL/PLATELET
Basophils Absolute: 0.1 x10E3/uL (ref 0.0–0.2)
Basos: 2 %
EOS (ABSOLUTE): 0.3 x10E3/uL (ref 0.0–0.4)
Eos: 6 %
Hematocrit: 47.5 % (ref 37.5–51.0)
Hemoglobin: 15.8 g/dL (ref 13.0–17.7)
Immature Grans (Abs): 0 x10E3/uL (ref 0.0–0.1)
Immature Granulocytes: 0 %
Lymphocytes Absolute: 1.8 x10E3/uL (ref 0.7–3.1)
Lymphs: 32 %
MCH: 33.2 pg — ABNORMAL HIGH (ref 26.6–33.0)
MCHC: 33.3 g/dL (ref 31.5–35.7)
MCV: 100 fL — ABNORMAL HIGH (ref 79–97)
Monocytes Absolute: 0.6 x10E3/uL (ref 0.1–0.9)
Monocytes: 10 %
Neutrophils Absolute: 3 x10E3/uL (ref 1.4–7.0)
Neutrophils: 50 %
Platelets: 205 x10E3/uL (ref 150–450)
RBC: 4.76 x10E6/uL (ref 4.14–5.80)
RDW: 13.3 % (ref 11.6–15.4)
WBC: 5.8 x10E3/uL (ref 3.4–10.8)

## 2024-05-25 LAB — LIPID PANEL
Chol/HDL Ratio: 2.9 ratio (ref 0.0–5.0)
Cholesterol, Total: 149 mg/dL (ref 100–199)
HDL: 51 mg/dL (ref >39)
LDL Chol Calc (NIH): 77 mg/dL (ref 0–99)
Triglycerides: 120 mg/dL (ref 0–149)
VLDL Cholesterol Cal: 21 mg/dL (ref 5–40)

## 2024-05-25 LAB — HEMOGLOBIN A1C
Est. average glucose Bld gHb Est-mCnc: 120 mg/dL
Hgb A1c MFr Bld: 5.8 % — ABNORMAL HIGH (ref 4.8–5.6)

## 2024-05-31 ENCOUNTER — Ambulatory Visit: Payer: Self-pay | Admitting: Internal Medicine

## 2024-05-31 ENCOUNTER — Ambulatory Visit (INDEPENDENT_AMBULATORY_CARE_PROVIDER_SITE_OTHER): Admitting: Internal Medicine

## 2024-05-31 VITALS — BP 118/74 | HR 83 | Temp 97.8°F | Ht 69.0 in | Wt 171.2 lb

## 2024-05-31 DIAGNOSIS — F17210 Nicotine dependence, cigarettes, uncomplicated: Secondary | ICD-10-CM | POA: Diagnosis not present

## 2024-05-31 DIAGNOSIS — Z23 Encounter for immunization: Secondary | ICD-10-CM | POA: Diagnosis not present

## 2024-05-31 DIAGNOSIS — E782 Mixed hyperlipidemia: Secondary | ICD-10-CM | POA: Diagnosis not present

## 2024-05-31 DIAGNOSIS — Z720 Tobacco use: Secondary | ICD-10-CM | POA: Diagnosis not present

## 2024-05-31 DIAGNOSIS — I10 Essential (primary) hypertension: Secondary | ICD-10-CM | POA: Diagnosis not present

## 2024-05-31 DIAGNOSIS — R7303 Prediabetes: Secondary | ICD-10-CM

## 2024-05-31 NOTE — Progress Notes (Signed)
 Established Patient Office Visit  Subjective:  Patient ID: Charles Madden, male    DOB: 02/07/1941  Age: 83 y.o. MRN: [id number]  Chief Complaint  Patient presents with  . Follow-up    3 month follow up    No new complaints, here for lab review and medication refills. Dizziness has improved.    No other concerns at this time.   Past Medical History:  Diagnosis Date  . Hyperlipidemia   . Hypertension   . Psoriasis     Past Surgical History:  Procedure Laterality Date  . APPENDECTOMY    . CATARACT EXTRACTION W/PHACO Right 06/22/2023   Procedure: CATARACT EXTRACTION PHACO AND INTRAOCULAR LENS PLACEMENT (IOC) RIGHT  9.67  01:01.0;  Surgeon: Mittie Gaskin, MD;  Location: [facility];  Service: Ophthalmology;  Laterality: Right;  . CATARACT EXTRACTION W/PHACO Left 07/05/2023   Procedure: CATARACT EXTRACTION PHACO AND INTRAOCULAR LENS PLACEMENT (IOC) LEFT 7.95 00:54.6;  Surgeon: Mittie Gaskin, MD;  Location: [facility];  Service: Ophthalmology;  Laterality: Left;    Social History   Socioeconomic History  . Marital status: Widowed    Spouse name: Not on file  . Number of children: Not on file  . Years of education: Not on file  . Highest education level: Not on file  Occupational History  . Not on file  Tobacco Use  . Smoking status: Every Day    Current packs/day: 0.25    Types: Cigarettes  . Smokeless tobacco: Never  Substance and Sexual Activity  . Alcohol use: Not Currently  . Drug use: Never  . Sexual activity: Not on file  Other Topics Concern  . Not on file  Social History Narrative  . Not on file   Social Drivers of Health   Financial Resource Strain: Not on file  Food Insecurity: Not on file  Transportation Needs: Not on file  Physical Activity: Not on file  Stress: Not on file  Social Connections: Not on file  Intimate Partner Violence: Not on file    No family history on file.  No Known Allergies  Outpatient  Medications Prior to Visit  Medication Sig  . albuterol  (VENTOLIN  HFA) 108 (90 Base) MCG/ACT inhaler Inhale 2 puffs into the lungs every 4 (four) hours as needed for wheezing or shortness of breath.  . amLODipine -benazepril  (LOTREL) 5-20 MG capsule TAKE 1 CAPSULE EVERY MORNING  . atorvastatin  (LIPITOR) 10 MG tablet TAKE 1 TABLET EVERY EVENING  . cetirizine (ZYRTEC ALLERGY) 10 MG tablet Take 1 tablet (10 mg total) by mouth daily.  . fluticasone (FLONASE) 50 MCG/ACT nasal spray Place 1 spray into both nostrils daily.  . folic acid (FOLVITE) 1 MG tablet Take 1 mg by mouth daily.  . meclizine  (ANTIVERT ) 25 MG tablet Take 1 tablet (25 mg total) by mouth 3 (three) times daily as needed for dizziness.  . methotrexate (RHEUMATREX) 2.5 MG tablet Take 5 mg by mouth once a week. Caution:Chemotherapy. Protect from light.  . pantoprazole (PROTONIX) 20 MG tablet TAKE 1 TABLET EVERY MORNING   No facility-administered medications prior to visit.    Review of Systems  Constitutional: Negative.  Negative for weight loss.  HENT:  Positive for congestion. Negative for ear discharge, ear pain and tinnitus.   Eyes: Negative.   Respiratory: Negative.    Cardiovascular: Negative.   Gastrointestinal: Negative.   Genitourinary: Negative.   Skin: Negative.   Neurological:  Positive for dizziness.  Endo/Heme/Allergies: Negative.  Objective:   BP 118/74   Pulse 83   Temp 97.8 F (36.6 C)   Ht 5' 9 (1.753 m)   Wt 171 lb 3.2 oz (77.7 kg)   SpO2 95%   BMI 25.28 kg/m   Vitals:   05/31/24 1047  BP: 118/74  Pulse: 83  Temp: 97.8 F (36.6 C)  Height: 5' 9 (1.753 m)  Weight: 171 lb 3.2 oz (77.7 kg)  SpO2: 95%  BMI (Calculated): 25.27    Physical Exam Vitals reviewed.  Constitutional:      Appearance: Normal appearance.  HENT:     Head: Normocephalic.     Left Ear: There is no impacted cerumen.     Nose: Nose normal.     Mouth/Throat:     Mouth: Mucous membranes are moist.      Pharynx: No posterior oropharyngeal erythema.  Eyes:     Extraocular Movements: Extraocular movements intact.     Pupils: Pupils are equal, round, and reactive to light.  Cardiovascular:     Rate and Rhythm: Regular rhythm.     Chest Wall: PMI is not displaced.     Pulses: Normal pulses.     Heart sounds: Normal heart sounds. No murmur heard. Pulmonary:     Effort: Pulmonary effort is normal.     Breath sounds: Normal air entry. No rhonchi or rales.  Abdominal:     General: Abdomen is flat. Bowel sounds are normal. There is no distension.     Palpations: Abdomen is soft. There is no hepatomegaly, splenomegaly or mass.     Tenderness: There is no abdominal tenderness.  Musculoskeletal:        General: Normal range of motion.     Cervical back: Normal range of motion and neck supple.     Right lower leg: 1+ Pitting Edema present.     Left lower leg: 1+ Edema present.  Skin:    General: Skin is warm and dry.     Comments: Hemosiderin staining of both legs  Neurological:     General: No focal deficit present.     Mental Status: He is alert and oriented to person, place, and time.     Cranial Nerves: No cranial nerve deficit.     Motor: No weakness.  Psychiatric:        Mood and Affect: Mood normal.        Behavior: Behavior normal.      No results found for any visits on 05/31/24.  Recent Results (from the past 2160 hours)  Lipid panel     Status: None   Collection Time: 05/24/24  8:17 AM  Result Value Ref Range   Cholesterol, Total 149 100 - 199 mg/dL   Triglycerides 879 0 - 149 mg/dL   HDL 51 >60 mg/dL   VLDL Cholesterol Cal 21 5 - 40 mg/dL   LDL Chol Calc (NIH) 77 0 - 99 mg/dL   Chol/HDL Ratio 2.9 0.0 - 5.0 ratio    Comment:                                   T. Chol/HDL Ratio                                             Men  Women  1/2 Avg.Risk  3.4    3.3                                   Avg.Risk  5.0    4.4                                 2X Avg.Risk  9.6    7.1                                3X Avg.Risk 23.4   11.0   Hemoglobin A1c     Status: Abnormal   Collection Time: 05/24/24  8:17 AM  Result Value Ref Range   Hgb A1c MFr Bld 5.8 (H) 4.8 - 5.6 %    Comment:          Prediabetes: 5.7 - 6.4          Diabetes: >6.4          Glycemic control for adults with diabetes: <7.0    Est. average glucose Bld gHb Est-mCnc 120 mg/dL  [id number]     Status: None   Collection Time: 05/24/24  8:17 AM  Result Value Ref Range   Glucose 96 70 - 99 mg/dL   BUN 14 8 - 27 mg/dL   Creatinine, Ser 9.13 0.76 - 1.27 mg/dL   eGFR 86 >40 fO/fpw/8.26   BUN/Creatinine Ratio 16 10 - 24   Sodium 140 134 - 144 mmol/L   Potassium 4.3 3.5 - 5.2 mmol/L   Chloride 103 96 - 106 mmol/L   CO2 24 20 - 29 mmol/L   Calcium  9.2 8.6 - 10.2 mg/dL   Total Protein 6.7 6.0 - 8.5 g/dL   Albumin 4.5 3.7 - 4.7 g/dL   Globulin, Total 2.2 1.5 - 4.5 g/dL   Bilirubin Total 0.5 0.0 - 1.2 mg/dL   Alkaline Phosphatase 76 48 - 129 IU/L   AST 21 0 - 40 IU/L   ALT 17 0 - 44 IU/L  CBC with Differential/Platelet     Status: Abnormal   Collection Time: 05/24/24  8:17 AM  Result Value Ref Range   WBC 5.8 3.4 - 10.8 x10E3/uL   RBC 4.76 4.14 - 5.80 x10E6/uL   Hemoglobin 15.8 13.0 - 17.7 g/dL   Hematocrit 52.4 62.4 - 51.0 %   MCV 100 (H) 79 - 97 fL   MCH 33.2 (H) 26.6 - 33.0 pg   MCHC 33.3 31.5 - 35.7 g/dL   RDW 86.6 88.3 - 84.5 %   Platelets 205 150 - 450 x10E3/uL   Neutrophils 50 Not Estab. %   Lymphs 32 Not Estab. %   Monocytes 10 Not Estab. %   Eos 6 Not Estab. %   Basos 2 Not Estab. %   Neutrophils Absolute 3.0 1.4 - 7.0 x10E3/uL   Lymphocytes Absolute 1.8 0.7 - 3.1 x10E3/uL   Monocytes Absolute 0.6 0.1 - 0.9 x10E3/uL   EOS (ABSOLUTE) 0.3 0.0 - 0.4 x10E3/uL   Basophils Absolute 0.1 0.0 - 0.2 x10E3/uL   Immature Granulocytes 0 Not Estab. %   Immature Grans (Abs) 0.0 0.0 - 0.1 x10E3/uL      Assessment & Plan:  Elyan was seen today for  follow-up.  Primary hypertension  Mixed hyperlipidemia  Needs flu shot -  Flu Vaccine Trivalent High Dose (Fluad)  Tobacco abuse  Smoking cessation instruction/counseling given:  counseled patient on the dangers of tobacco use, advised patient to stop smoking, and reviewed strategies to maximize success   Problem List Items Addressed This Visit       Cardiovascular and Mediastinum   Primary hypertension - Primary     Other   Mixed hyperlipidemia   Other Visit Diagnoses       Needs flu shot       Relevant Orders   Flu Vaccine Trivalent High Dose (Fluad)     Tobacco abuse           Return in about 3 months (around 08/31/2024) for fu with labs prior.   Total time spent: 20 minutes. This time includes review of previous notes and results and patient face to face interaction during today'Gauge Winski visit.    Sherrill Cinderella Perry, MD  05/31/2024   This document may have been prepared by [facility] Voice Recognition software and as such may include unintentional dictation errors.

## 2024-06-24 NOTE — Progress Notes (Signed)
 Charles Madden                                          MRN: [id number]   06/24/2024   The [facility] Quality Team Specialist reviewed this patient medical record for the purposes of chart review for care gap closure. The following were reviewed: abstraction for care gap closure-controlling blood pressure.    [facility] Quality Team

## 2024-06-26 ENCOUNTER — Other Ambulatory Visit: Payer: Self-pay | Admitting: Internal Medicine

## 2024-06-26 DIAGNOSIS — E782 Mixed hyperlipidemia: Secondary | ICD-10-CM

## 2024-08-23 ENCOUNTER — Other Ambulatory Visit

## 2024-08-23 DIAGNOSIS — E782 Mixed hyperlipidemia: Secondary | ICD-10-CM

## 2024-08-23 DIAGNOSIS — I10 Essential (primary) hypertension: Secondary | ICD-10-CM

## 2024-08-23 DIAGNOSIS — R7303 Prediabetes: Secondary | ICD-10-CM

## 2024-08-30 ENCOUNTER — Ambulatory Visit: Admitting: Internal Medicine
